# Patient Record
Sex: Female | Born: 1960
Health system: Southern US, Community
[De-identification: ages and names within clinical notes are randomized; demographics above are authoritative.]

## PROBLEM LIST (undated history)

## (undated) DIAGNOSIS — R0602 Shortness of breath: Secondary | ICD-10-CM

## (undated) DIAGNOSIS — N979 Female infertility, unspecified: Secondary | ICD-10-CM

## (undated) DIAGNOSIS — E039 Hypothyroidism, unspecified: Secondary | ICD-10-CM

## (undated) DIAGNOSIS — J45909 Unspecified asthma, uncomplicated: Secondary | ICD-10-CM

## (undated) DIAGNOSIS — D649 Anemia, unspecified: Secondary | ICD-10-CM

## (undated) DIAGNOSIS — E669 Obesity, unspecified: Secondary | ICD-10-CM

## (undated) DIAGNOSIS — K219 Gastro-esophageal reflux disease without esophagitis: Secondary | ICD-10-CM

## (undated) DIAGNOSIS — F32A Depression, unspecified: Secondary | ICD-10-CM

## (undated) DIAGNOSIS — M1990 Unspecified osteoarthritis, unspecified site: Secondary | ICD-10-CM

## (undated) DIAGNOSIS — E559 Vitamin D deficiency, unspecified: Secondary | ICD-10-CM

## (undated) DIAGNOSIS — T7840XA Allergy, unspecified, initial encounter: Secondary | ICD-10-CM

## (undated) HISTORY — DX: Vitamin D deficiency, unspecified: E55.9

## (undated) HISTORY — DX: Female infertility, unspecified: N97.9

## (undated) HISTORY — DX: Gastro-esophageal reflux disease without esophagitis: K21.9

## (undated) HISTORY — DX: Unspecified osteoarthritis, unspecified site: M19.90

## (undated) HISTORY — DX: Unspecified asthma, uncomplicated: J45.909

## (undated) HISTORY — DX: Allergy, unspecified, initial encounter: T78.40XA

## (undated) HISTORY — DX: Hypothyroidism, unspecified: E03.9

## (undated) HISTORY — DX: Depression, unspecified: F32.A

## (undated) HISTORY — DX: Obesity, unspecified: E66.9

## (undated) HISTORY — DX: Anemia, unspecified: D64.9

## (undated) HISTORY — DX: Shortness of breath: R06.02

---
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
## 1996-01-13 HISTORY — PX: OTHER SURGICAL HISTORY: SHX169

## 1997-05-31 ENCOUNTER — Other Ambulatory Visit: Admission: RE | Admit: 1997-05-31 | Discharge: 1997-05-31 | Payer: Self-pay | Admitting: Obstetrics and Gynecology

## 1997-06-05 ENCOUNTER — Ambulatory Visit (HOSPITAL_COMMUNITY): Admission: RE | Admit: 1997-06-05 | Discharge: 1997-06-05 | Payer: Self-pay | Admitting: Obstetrics and Gynecology

## 1997-06-08 ENCOUNTER — Ambulatory Visit (HOSPITAL_COMMUNITY): Admission: RE | Admit: 1997-06-08 | Discharge: 1997-06-08 | Payer: Self-pay | Admitting: Obstetrics and Gynecology

## 1997-06-12 ENCOUNTER — Ambulatory Visit (HOSPITAL_COMMUNITY): Admission: RE | Admit: 1997-06-12 | Discharge: 1997-06-12 | Payer: Self-pay | Admitting: Obstetrics and Gynecology

## 1997-06-27 ENCOUNTER — Ambulatory Visit (HOSPITAL_COMMUNITY): Admission: RE | Admit: 1997-06-27 | Discharge: 1997-06-27 | Payer: Self-pay | Admitting: Obstetrics and Gynecology

## 1997-06-29 ENCOUNTER — Ambulatory Visit (HOSPITAL_COMMUNITY): Admission: RE | Admit: 1997-06-29 | Discharge: 1997-06-29 | Payer: Self-pay | Admitting: Obstetrics and Gynecology

## 1997-07-20 ENCOUNTER — Other Ambulatory Visit: Admission: RE | Admit: 1997-07-20 | Discharge: 1997-07-20 | Payer: Self-pay | Admitting: Obstetrics and Gynecology

## 1997-12-20 ENCOUNTER — Encounter: Admission: RE | Admit: 1997-12-20 | Discharge: 1998-03-20 | Payer: Self-pay | Admitting: Obstetrics & Gynecology

## 1998-02-11 ENCOUNTER — Inpatient Hospital Stay (HOSPITAL_COMMUNITY): Admission: AD | Admit: 1998-02-11 | Discharge: 1998-02-11 | Payer: Self-pay | Admitting: Obstetrics & Gynecology

## 1998-02-13 ENCOUNTER — Inpatient Hospital Stay (HOSPITAL_COMMUNITY): Admission: AD | Admit: 1998-02-13 | Discharge: 1998-02-13 | Payer: Self-pay | Admitting: Obstetrics and Gynecology

## 1998-02-15 ENCOUNTER — Inpatient Hospital Stay (HOSPITAL_COMMUNITY): Admission: AD | Admit: 1998-02-15 | Discharge: 1998-02-19 | Payer: Self-pay | Admitting: Obstetrics and Gynecology

## 1998-02-18 ENCOUNTER — Encounter (HOSPITAL_COMMUNITY): Admission: RE | Admit: 1998-02-18 | Discharge: 1998-05-19 | Payer: Self-pay | Admitting: Obstetrics and Gynecology

## 1998-04-15 ENCOUNTER — Encounter: Admission: RE | Admit: 1998-04-15 | Discharge: 1998-07-14 | Payer: Self-pay | Admitting: Obstetrics and Gynecology

## 1999-07-24 ENCOUNTER — Other Ambulatory Visit: Admission: RE | Admit: 1999-07-24 | Discharge: 1999-07-24 | Payer: Self-pay | Admitting: *Deleted

## 2001-11-21 ENCOUNTER — Other Ambulatory Visit: Admission: RE | Admit: 2001-11-21 | Discharge: 2001-11-21 | Payer: Self-pay | Admitting: Obstetrics and Gynecology

## 2003-10-30 ENCOUNTER — Other Ambulatory Visit: Admission: RE | Admit: 2003-10-30 | Discharge: 2003-10-30 | Payer: Self-pay | Admitting: Obstetrics and Gynecology

## 2004-04-14 ENCOUNTER — Other Ambulatory Visit: Admission: RE | Admit: 2004-04-14 | Discharge: 2004-04-14 | Payer: Self-pay | Admitting: Obstetrics and Gynecology

## 2010-02-13 DIAGNOSIS — E039 Hypothyroidism, unspecified: Secondary | ICD-10-CM | POA: Insufficient documentation

## 2010-02-13 DIAGNOSIS — J309 Allergic rhinitis, unspecified: Secondary | ICD-10-CM | POA: Insufficient documentation

## 2010-10-31 ENCOUNTER — Emergency Department (HOSPITAL_COMMUNITY)
Admission: EM | Admit: 2010-10-31 | Discharge: 2010-11-01 | Disposition: A | Discharge status: Home / Self Care | Payer: 59 | Attending: Emergency Medicine | Admitting: Emergency Medicine

## 2010-10-31 ENCOUNTER — Inpatient Hospital Stay (INDEPENDENT_AMBULATORY_CARE_PROVIDER_SITE_OTHER)
Admission: RE | Admit: 2010-10-31 | Discharge: 2010-10-31 | Disposition: A | Discharge status: Home / Self Care | Payer: 59 | Source: Ambulatory Visit | Attending: Emergency Medicine | Admitting: Emergency Medicine

## 2010-10-31 ENCOUNTER — Emergency Department (HOSPITAL_COMMUNITY): Payer: 59

## 2010-10-31 DIAGNOSIS — R5381 Other malaise: Secondary | ICD-10-CM | POA: Insufficient documentation

## 2010-10-31 DIAGNOSIS — R0602 Shortness of breath: Secondary | ICD-10-CM | POA: Insufficient documentation

## 2010-10-31 DIAGNOSIS — E079 Disorder of thyroid, unspecified: Secondary | ICD-10-CM | POA: Insufficient documentation

## 2010-10-31 DIAGNOSIS — R079 Chest pain, unspecified: Secondary | ICD-10-CM

## 2010-10-31 LAB — DIFFERENTIAL
Basophils Absolute: 0.1 10*3/uL (ref 0.0–0.1)
Lymphocytes Relative: 33 % (ref 12–46)
Lymphs Abs: 4.6 10*3/uL — ABNORMAL HIGH (ref 0.7–4.0)
Monocytes Absolute: 1 10*3/uL (ref 0.1–1.0)
Neutro Abs: 7.7 10*3/uL (ref 1.7–7.7)

## 2010-10-31 LAB — CBC
HCT: 37.9 % (ref 36.0–46.0)
Hemoglobin: 12.3 g/dL (ref 12.0–15.0)
MCHC: 32.5 g/dL (ref 30.0–36.0)
MCV: 79.3 fL (ref 78.0–100.0)
WBC: 13.7 10*3/uL — ABNORMAL HIGH (ref 4.0–10.5)

## 2010-10-31 LAB — BASIC METABOLIC PANEL
BUN: 17 mg/dL (ref 6–23)
Chloride: 105 mEq/L (ref 96–112)
Glucose, Bld: 100 mg/dL — ABNORMAL HIGH (ref 70–99)
Potassium: 3.5 mEq/L (ref 3.5–5.1)

## 2010-10-31 LAB — POCT I-STAT TROPONIN I: Troponin i, poc: 0 ng/mL (ref 0.00–0.08)

## 2011-10-16 ENCOUNTER — Other Ambulatory Visit: Payer: Self-pay | Admitting: Family Medicine

## 2011-10-16 DIAGNOSIS — K219 Gastro-esophageal reflux disease without esophagitis: Secondary | ICD-10-CM | POA: Insufficient documentation

## 2011-10-16 DIAGNOSIS — Z1231 Encounter for screening mammogram for malignant neoplasm of breast: Secondary | ICD-10-CM

## 2011-11-04 ENCOUNTER — Ambulatory Visit: Payer: 59

## 2012-04-22 DIAGNOSIS — J45909 Unspecified asthma, uncomplicated: Secondary | ICD-10-CM | POA: Insufficient documentation

## 2018-01-21 ENCOUNTER — Other Ambulatory Visit: Payer: Self-pay | Admitting: Family Medicine

## 2018-01-21 DIAGNOSIS — Z1231 Encounter for screening mammogram for malignant neoplasm of breast: Secondary | ICD-10-CM

## 2019-02-21 ENCOUNTER — Other Ambulatory Visit: Payer: Self-pay | Admitting: Family Medicine

## 2019-02-21 DIAGNOSIS — Z1231 Encounter for screening mammogram for malignant neoplasm of breast: Secondary | ICD-10-CM

## 2019-03-30 ENCOUNTER — Ambulatory Visit: Payer: Self-pay | Attending: Internal Medicine

## 2019-03-30 DIAGNOSIS — Z23 Encounter for immunization: Secondary | ICD-10-CM

## 2019-03-30 NOTE — Progress Notes (Signed)
   Covid-19 Vaccination Clinic  Name:  HAYDAN WEDIG    MRN: [id number] DOB: Jun 01, 1960  03/30/2019  Ms. Marsico was observed post Covid-19 immunization for 15 minutes without incident. She was provided with Vaccine Information Sheet and instruction to access the V-Safe system.   Ms. Elliston was instructed to call 911 with any severe reactions post vaccine: Marland Kitchen Difficulty breathing  . Swelling of face and throat  . A fast heartbeat  . A bad rash all over body  . Dizziness and weakness   Immunizations Administered    Name Date Dose VIS Date Route   Pfizer COVID-19 Vaccine 03/30/2019  1:42 PM 0.3 mL 12/23/2018 Intramuscular   Manufacturer: [employer], [employer]   Lot: [id number]   NDC: [address]-5

## 2019-04-07 ENCOUNTER — Ambulatory Visit
Admission: RE | Admit: 2019-04-07 | Discharge: 2019-04-07 | Disposition: A | Discharge status: Home / Self Care | Payer: BC Managed Care – PPO | Source: Ambulatory Visit | Attending: Family Medicine | Admitting: Family Medicine

## 2019-04-07 ENCOUNTER — Other Ambulatory Visit: Payer: Self-pay

## 2019-04-07 DIAGNOSIS — Z1231 Encounter for screening mammogram for malignant neoplasm of breast: Secondary | ICD-10-CM

## 2019-04-24 ENCOUNTER — Ambulatory Visit: Payer: BC Managed Care – PPO | Attending: Internal Medicine

## 2019-04-24 DIAGNOSIS — Z23 Encounter for immunization: Secondary | ICD-10-CM

## 2019-04-24 NOTE — Progress Notes (Signed)
   Covid-19 Vaccination Clinic  Name:  Janice Brown    MRN: [id number] DOB: 1960-07-07  04/24/2019  [name] was observed post Covid-19 immunization for 15 minutes without incident. She was provided with Vaccine Information Sheet and instruction to access the V-Safe system.   [name] was instructed to call 911 with any severe reactions post vaccine: Marland Kitchen Difficulty breathing  . Swelling of face and throat  . A fast heartbeat  . A bad rash all over body  . Dizziness and weakness   Immunizations Administered    Name Date Dose VIS Date Route   Pfizer COVID-19 Vaccine 04/24/2019  2:48 PM 0.3 mL 12/23/2018 Intramuscular   Manufacturer: [employer], [employer]   Lot: [id number]   NDC: [address]-3

## 2019-05-25 ENCOUNTER — Encounter (INDEPENDENT_AMBULATORY_CARE_PROVIDER_SITE_OTHER): Payer: Self-pay | Admitting: Family Medicine

## 2019-05-25 ENCOUNTER — Other Ambulatory Visit: Payer: Self-pay

## 2019-05-25 ENCOUNTER — Ambulatory Visit (INDEPENDENT_AMBULATORY_CARE_PROVIDER_SITE_OTHER): Payer: BC Managed Care – PPO | Admitting: Family Medicine

## 2019-05-25 VITALS — BP 108/70 | HR 63 | Temp 97.9°F | Ht 64.0 in | Wt 222.0 lb

## 2019-05-25 DIAGNOSIS — R632 Polyphagia: Secondary | ICD-10-CM

## 2019-05-25 DIAGNOSIS — Z6838 Body mass index (BMI) 38.0-38.9, adult: Secondary | ICD-10-CM

## 2019-05-25 DIAGNOSIS — R0602 Shortness of breath: Secondary | ICD-10-CM | POA: Diagnosis not present

## 2019-05-25 DIAGNOSIS — E66812 Obesity, class 2: Secondary | ICD-10-CM

## 2019-05-25 DIAGNOSIS — R5383 Other fatigue: Secondary | ICD-10-CM | POA: Diagnosis not present

## 2019-05-25 DIAGNOSIS — F5089 Other specified eating disorder: Secondary | ICD-10-CM

## 2019-05-25 DIAGNOSIS — E039 Hypothyroidism, unspecified: Secondary | ICD-10-CM | POA: Diagnosis not present

## 2019-05-25 DIAGNOSIS — Z1331 Encounter for screening for depression: Secondary | ICD-10-CM | POA: Diagnosis not present

## 2019-05-25 DIAGNOSIS — Z9189 Other specified personal risk factors, not elsewhere classified: Secondary | ICD-10-CM | POA: Diagnosis not present

## 2019-05-25 DIAGNOSIS — Z0289 Encounter for other administrative examinations: Secondary | ICD-10-CM

## 2019-05-25 NOTE — Progress Notes (Signed)
Chief Complaint:   OBESITY Janice Brown (MR# [id number]) is a 59 y.o. female who presents for evaluation and treatment of obesity and related comorbidities. Current BMI is Body mass index is 38.11 kg/m. Janice Brown has been struggling with her weight for many years and has been unsuccessful in either losing weight, maintaining weight loss, or reaching her healthy weight goal.  Janice Brown is currently in the action stage of change and ready to dedicate time achieving and maintaining a healthier weight. Janice Brown is interested in becoming our patient and working on intensive lifestyle modifications including (but not limited to) diet and exercise for weight loss.  Janice Brown is on a whole food, plant-based diet.  She has a Optometrist and swims daily.    Janice Brown provided the following food recall today:  Breakfast:  Muesli, oats, pumpkin seed, almonds, dates, plant milk, frozen wild blueberries. Snack:  2 cups of coffee. Lunch:  Salad, chick pea curry, lentil soup. Snack:  Hummus. Dinner:  Janice Brown tends to be a night eater, but this is improving.   Janice Brown's habits were reviewed today and are as follows: Her family eats meals together, she thinks her family will eat healthier with her, her desired weight loss is 87 pounds, she has been heavy most of her life, she started gaining weight in her 12s, her heaviest weight ever was 240 pounds, she is trying to follow a vegan diet, she is frequently drinking liquids with calories, she frequently eats larger portions than normal and she struggles with emotional eating.  Depression Screen Janice Brown's Food and Mood (modified PHQ-9) score was 8.  Depression screen PHQ 2/9 05/25/2019  Decreased Interest 1  Down, Depressed, Hopeless 1  PHQ - 2 Score 2  Altered sleeping 0  Tired, decreased energy 2  Change in appetite 1  Feeling bad or failure about yourself  2  Trouble concentrating 0  Moving slowly or fidgety/restless 1  Suicidal  thoughts 0  PHQ-9 Score 8  Difficult doing work/chores Somewhat difficult   Subjective:   1. Other fatigue Janice Brown denies daytime somnolence and denies waking up still tired. Janice Brown generally gets 7 or 8 hours of sleep per night, and states that she has generally restful sleep. Snoring is not present. Apneic episodes are not present. Epworth Sleepiness Score is 3.  2. SOB (shortness of breath) on exertion Janice Brown notes increasing shortness of breath with exercising and seems to be worsening over time with weight gain. She notes getting out of breath sooner with activity than she used to. This has gotten worse recently. Janice Brown denies shortness of breath at rest or orthopnea.  3. Hypothyroidism, unspecified type Janice Brown is taking Armour Thyroid 120 mg + 30 mg daily.  4. Polyphagia Janice Brown endorses excessive hunger.   5. 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  6. Depression screening Janice Brown was screened for depression as part of her new patient workup.  PHQ-9 is 8.  7. At risk for deficient intake of food The patient is at a higher than average risk of deficient intake of food.  Assessment/Plan:   1. Other fatigue [name] does feel that her weight is causing her energy to be lower than it should be. Fatigue may be related to obesity, depression or many other causes. Labs will be ordered, and in the meanwhile, Janice Brown will focus on self care including making healthy food choices, increasing physical activity and focusing on stress reduction. - EKG 12-Lead - Comprehensive metabolic panel - CBC with Differential/Platelet -  Hemoglobin A1c - Insulin, random - Lipid panel - VITAMIN D 25 Hydroxy (Vit-D Deficiency, Fractures) - Anemia panel  2. SOB (shortness of breath) on exertion Janice Brown does feel that she gets out of breath more easily that she used to when she exercises. Janice Brown's shortness of breath appears to be obesity related and  exercise induced. She has agreed to work on weight loss and gradually increase exercise to treat her exercise induced shortness of breath. Will continue to monitor closely.  3. Hypothyroidism, unspecified type Patient with long-standing hypothyroidism, on levothyroxine therapy. She appears euthyroid. Orders and follow up as documented in patient record.  Counseling . Good thyroid control is important for overall health. Supratherapeutic thyroid levels are dangerous and will not improve weight loss results. . The correct way to take levothyroxine is fasting, with water, separated by at least 30 minutes from breakfast, and separated by more than 4 hours from calcium, iron, multivitamins, acid reflux medications (PPIs).  - TSH - T4, free - T3  4. Polyphagia Intensive lifestyle modifications are the first line treatment for this issue. We discussed several lifestyle modifications today and she will continue to work on diet, exercise and weight loss efforts. Orders and follow up as documented in patient record.  Counseling . Polyphagia is excessive hunger. . Causes can include: low blood sugars, hypERthyroidism, PMS, lack of sleep, stress, insulin resistance, diabetes, certain medications, and diets that are deficient in protein and fiber.   5. Other disorder of eating Suggest Janice Brown begin going to bed earlier and get some exercise to help relieve stress.  6. Depression screening Janice Brown had a positive depression screening. Depression is commonly associated with obesity and often results in emotional eating behaviors. We will monitor this closely and work on CBT to help improve the non-hunger eating patterns. Referral to Psychology may be required if no improvement is seen as she continues in our clinic.  7. At risk for deficient intake of food Janice Brown was given approximately 15 minutes of deficit intake of food prevention counseling today. Janice Brown is at risk for eating too few calories  based on current food recall. She was encouraged to focus on meeting caloric and protein goals according to her recommended meal plan.   8. Class 2 severe obesity with serious comorbidity and body mass index (BMI) of 38.0 to 38.9 in adult, unspecified obesity type [facility]) Janice Brown is currently in the action stage of change and her goal is to continue with weight loss efforts. I recommend Janice Brown begin the structured treatment plan as follows:  She has agreed to keeping a food journal and adhering to recommended goals of 1500 calories and 95+ grams of protein.  Exercise goals: As is.   Behavioral modification strategies: increasing lean protein intake and ways to avoid night time snacking.  She was informed of the importance of frequent follow-up visits to maximize her success with intensive lifestyle modifications for her multiple health conditions. She was informed we would discuss her lab results at her next visit unless there is a critical issue that needs to be addressed sooner. Janice Brown agreed to keep her next visit at the agreed upon time to discuss these results.  Objective:   Blood pressure 108/70, pulse 63, temperature 97.9 F (36.6 C), temperature source Oral, height 5\' 4"  (1.626 m), weight 222 lb (100.7 kg), SpO2 96 %. Body mass index is 38.11 kg/m.  EKG: Normal sinus rhythm, rate 58 bpm.  Indirect Calorimeter completed today shows a VO2 of 291 and a REE  of 2026.  Her calculated basal metabolic rate is 1829 thus her basal metabolic rate is better than expected.  General: Cooperative, alert, well developed, in no acute distress. HEENT: Conjunctivae and lids unremarkable. Cardiovascular: Regular rhythm.  Lungs: Normal work of breathing. Neurologic: No focal deficits.   Lab Results  Component Value Date   CREATININE 0.65 10/31/2010   BUN 17 10/31/2010   NA 138 10/31/2010   K 3.5 10/31/2010   CL 105 10/31/2010   CO2 24 10/31/2010   Lab Results  Component Value Date    WBC 13.7 (H) 10/31/2010   HGB 12.3 10/31/2010   HCT 37.9 10/31/2010   MCV 79.3 10/31/2010   PLT 353 10/31/2010   Attestation Statements:   This is the patient's first visit at [facility]. The patient's NEW PATIENT PACKET was reviewed at length. Included in the packet: current and past health history, medications, allergies, ROS, gynecologic history (women only), surgical history, family history, social history, weight history, weight loss surgery history (for those that have had weight loss surgery), nutritional evaluation, mood and food questionnaire, PHQ9, Epworth questionnaire, sleep habits questionnaire, patient life and health improvement goals questionnaire. These will all be scanned into the patient's chart under media.   During the visit, I independently reviewed the patient's EKG, bioimpedance scale results, and indirect calorimeter results. I used this information to tailor a meal plan for the patient that will help her to lose weight and will improve her obesity-related conditions going forward. I performed a medically necessary appropriate examination and/or evaluation. I discussed the assessment and treatment plan with the patient. The patient was provided an opportunity to ask questions and all were answered. The patient agreed with the plan and demonstrated an understanding of the instructions. Labs were ordered at this visit and will be reviewed at the next visit unless more critical results need to be addressed immediately. Clinical information was updated and documented in the EMR.   I, Insurance claims handler, CMA, am acting as Energy manager for [employer], DO.  I have reviewed the above documentation for accuracy and completeness, and I agree with the above. Helane Rima, DO

## 2019-05-26 LAB — ANEMIA PANEL
Ferritin: 62 ng/mL (ref 15–150)
Folate, Hemolysate: 285 ng/mL
Folate, RBC: 638 ng/mL (ref >498)
Hematocrit: 44.7 % (ref 34.0–46.6)
Iron Saturation: 28 % (ref 15–55)
Iron: 109 ug/dL (ref 27–159)
Retic Ct Pct: 1.3 % (ref 0.6–2.6)
Total Iron Binding Capacity: 385 ug/dL (ref 250–450)
UIBC: 276 ug/dL (ref 131–425)
Vitamin B-12: 1116 pg/mL (ref 232–1245)

## 2019-05-26 LAB — T4, FREE: Free T4: 0.92 ng/dL (ref 0.82–1.77)

## 2019-05-26 LAB — CBC WITH DIFFERENTIAL/PLATELET
Basophils Absolute: 0.1 10*3/uL (ref 0.0–0.2)
Basos: 2 %
EOS (ABSOLUTE): 0.3 10*3/uL (ref 0.0–0.4)
Eos: 3 %
Hemoglobin: 14.4 g/dL (ref 11.1–15.9)
Immature Grans (Abs): 0 10*3/uL (ref 0.0–0.1)
Immature Granulocytes: 1 %
Lymphocytes Absolute: 2.9 10*3/uL (ref 0.7–3.1)
Lymphs: 34 %
MCH: 27.2 pg (ref 26.6–33.0)
MCHC: 32.2 g/dL (ref 31.5–35.7)
MCV: 84 fL (ref 79–97)
Monocytes Absolute: 0.5 10*3/uL (ref 0.1–0.9)
Monocytes: 6 %
Neutrophils Absolute: 4.8 10*3/uL (ref 1.4–7.0)
Neutrophils: 54 %
Platelets: 414 10*3/uL (ref 150–450)
RBC: 5.3 x10E6/uL — ABNORMAL HIGH (ref 3.77–5.28)
RDW: 13 % (ref 11.7–15.4)
WBC: 8.6 10*3/uL (ref 3.4–10.8)

## 2019-05-26 LAB — COMPREHENSIVE METABOLIC PANEL
ALT: 23 IU/L (ref 0–32)
AST: 20 IU/L (ref 0–40)
Albumin/Globulin Ratio: 1.5 (ref 1.2–2.2)
Albumin: 4.3 g/dL (ref 3.8–4.9)
Alkaline Phosphatase: 111 IU/L (ref 39–117)
BUN/Creatinine Ratio: 13 (ref 9–23)
BUN: 10 mg/dL (ref 6–24)
Bilirubin Total: 0.5 mg/dL (ref 0.0–1.2)
CO2: 23 mmol/L (ref 20–29)
Calcium: 9.8 mg/dL (ref 8.7–10.2)
Chloride: 105 mmol/L (ref 96–106)
Creatinine, Ser: 0.79 mg/dL (ref 0.57–1.00)
GFR calc Af Amer: 95 mL/min/{1.73_m2} (ref >59)
GFR calc non Af Amer: 82 mL/min/{1.73_m2} (ref >59)
Globulin, Total: 2.8 g/dL (ref 1.5–4.5)
Glucose: 86 mg/dL (ref 65–99)
Potassium: 4.8 mmol/L (ref 3.5–5.2)
Sodium: 144 mmol/L (ref 134–144)
Total Protein: 7.1 g/dL (ref 6.0–8.5)

## 2019-05-26 LAB — INSULIN, RANDOM: INSULIN: 17.6 u[IU]/mL (ref 2.6–24.9)

## 2019-05-26 LAB — LIPID PANEL
Chol/HDL Ratio: 3.6 ratio (ref 0.0–4.4)
Cholesterol, Total: 193 mg/dL (ref 100–199)
HDL: 53 mg/dL (ref >39)
LDL Chol Calc (NIH): 112 mg/dL — ABNORMAL HIGH (ref 0–99)
Triglycerides: 163 mg/dL — ABNORMAL HIGH (ref 0–149)
VLDL Cholesterol Cal: 28 mg/dL (ref 5–40)

## 2019-05-26 LAB — HEMOGLOBIN A1C
Est. average glucose Bld gHb Est-mCnc: 120 mg/dL
Hgb A1c MFr Bld: 5.8 % — ABNORMAL HIGH (ref 4.8–5.6)

## 2019-05-26 LAB — VITAMIN D 25 HYDROXY (VIT D DEFICIENCY, FRACTURES): Vit D, 25-Hydroxy: 36.7 ng/mL (ref 30.0–100.0)

## 2019-05-26 LAB — TSH: TSH: 0.235 u[IU]/mL — ABNORMAL LOW (ref 0.450–4.500)

## 2019-05-26 LAB — T3: T3, Total: 124 ng/dL (ref 71–180)

## 2019-06-08 ENCOUNTER — Encounter (INDEPENDENT_AMBULATORY_CARE_PROVIDER_SITE_OTHER): Payer: Self-pay | Admitting: Family Medicine

## 2019-06-08 ENCOUNTER — Ambulatory Visit (INDEPENDENT_AMBULATORY_CARE_PROVIDER_SITE_OTHER): Payer: BC Managed Care – PPO | Admitting: Family Medicine

## 2019-06-08 ENCOUNTER — Other Ambulatory Visit: Payer: Self-pay

## 2019-06-08 VITALS — BP 116/77 | HR 47 | Temp 97.5°F | Ht 64.0 in | Wt 220.0 lb

## 2019-06-08 DIAGNOSIS — E559 Vitamin D deficiency, unspecified: Secondary | ICD-10-CM

## 2019-06-08 DIAGNOSIS — E785 Hyperlipidemia, unspecified: Secondary | ICD-10-CM

## 2019-06-08 DIAGNOSIS — R7303 Prediabetes: Secondary | ICD-10-CM

## 2019-06-08 DIAGNOSIS — Z6837 Body mass index (BMI) 37.0-37.9, adult: Secondary | ICD-10-CM

## 2019-06-08 DIAGNOSIS — E66812 Obesity, class 2: Secondary | ICD-10-CM

## 2019-06-13 MED ORDER — OZEMPIC (0.25 OR 0.5 MG/DOSE) 2 MG/1.5ML ~~LOC~~ SOPN: PEN_INJECTOR | SUBCUTANEOUS | 0 refills | Status: DC

## 2019-06-13 NOTE — Progress Notes (Signed)
Chief Complaint:   OBESITY Janice Brown is here to discuss her progress with her obesity treatment plan along with follow-up of her obesity related diagnoses. Janice Brown is on keeping a food journal and adhering to recommended goals of 1500 calories and 90 grams of protein and states she is following her eating plan approximately 100% of the time. Janice Brown states she is swimming and using the stationary bike for 45-50 minutes 7 times per week.  Today's visit was #: 2 Starting weight: 222 lbs Starting date: 05/25/2019 Today's weight: 220 lbs Today's date: 06/08/2019 Total lbs lost to date: 2 lbs Total lbs lost since last in-office visit: 2 lbs  Interim History: Janice Brown is doing well.  She is getting around 1500 calories and 86-103 grams of protein per day.  She is up 3 pounds of water weight.  Subjective:   1. Prediabetes Janice Brown has a diagnosis of prediabetes based on her elevated HgA1c and was informed this puts her at greater risk of developing diabetes. She continues to work on diet and exercise to decrease her risk of diabetes. She denies nausea or hypoglycemia.  Lab Results  Component Value Date   HGBA1C 5.8 (H) 05/25/2019   Lab Results  Component Value Date   INSULIN 17.6 05/25/2019   2. Hyperlipidemia, unspecified hyperlipidemia type Janice Brown has hyperlipidemia and has been trying to improve her cholesterol levels with intensive lifestyle modification including a low saturated fat diet, exercise and weight loss. She denies any chest pain, claudication or myalgias.  Lab Results  Component Value Date   ALT 23 05/25/2019   AST 20 05/25/2019   ALKPHOS 111 05/25/2019   BILITOT 0.5 05/25/2019   Lab Results  Component Value Date   CHOL 193 05/25/2019   HDL 53 05/25/2019   LDLCALC 112 (H) 05/25/2019   TRIG 163 (H) 05/25/2019   CHOLHDL 3.6 05/25/2019   3. Vitamin D deficiency Janice Brown's Vitamin D level was 05/25/2019 on 36.7. She is currently taking OTC vitamin D  5000 IU each day. She denies nausea, vomiting or muscle weakness.  Assessment/Plan:   1. Prediabetes Janice Brown will continue to work on weight loss, exercise, and decreasing simple carbohydrates to help decrease the risk of diabetes.   Orders - Semaglutide,0.25 or 0.5MG /DOS, (OZEMPIC, 0.25 OR 0.5 MG/DOSE,) 2 MG/1.5ML SOPN; Start with 0.25 weekly for 2 weeks then increase to 0.5 mg weekly  Dispense: 5 pen; Refill: 0  2. Hyperlipidemia, unspecified hyperlipidemia type Cardiovascular risk and specific lipid/LDL goals reviewed.  We discussed several lifestyle modifications today and Janice Brown will continue to work on diet, exercise and weight loss efforts. Orders and follow up as documented in patient record.   Counseling Intensive lifestyle modifications are the first line treatment for this issue. . Dietary changes: Increase soluble fiber. Decrease simple carbohydrates. . Exercise changes: Moderate to vigorous-intensity aerobic activity 150 minutes per week if tolerated. . Lipid-lowering medications: see documented in medical record.  3. Vitamin D deficiency Low Vitamin D level contributes to fatigue and are associated with obesity, breast, and colon cancer. She agrees to continue to take OTC Vitamin D @5 ,000 IU daily and will follow-up for routine testing of Vitamin D, at least 2-3 times per year to avoid over-replacement.  4. Class 2 severe obesity with serious comorbidity and body mass index (BMI) of 37.0 to 37.9 in adult, unspecified obesity type [facility]) Janice Brown is currently in the action stage of change. As such, her goal is to continue with weight loss efforts. She has agreed to  keeping a food journal and adhering to recommended goals of 1500 calories and 90 grams of protein and vegan diet.   Exercise goals: For substantial health benefits, adults should do at least 150 minutes (2 hours and 30 minutes) a week of moderate-intensity, or 75 minutes (1 hour and 15 minutes) a week of  vigorous-intensity aerobic physical activity, or an equivalent combination of moderate- and vigorous-intensity aerobic activity. Aerobic activity should be performed in episodes of at least 10 minutes, and preferably, it should be spread throughout the week.  Behavioral modification strategies: increasing lean protein intake and increasing water intake.  Janice Brown has agreed to follow-up with our clinic in 2 weeks. She was informed of the importance of frequent follow-up visits to maximize her success with intensive lifestyle modifications for her multiple health conditions.   Objective:   Blood pressure 116/77, pulse (!) 47, temperature (!) 97.5 F (36.4 C), temperature source Oral, height 5\' 4"  (1.626 m), weight 220 lb (99.8 kg), SpO2 98 %. Body mass index is 37.76 kg/m.  General: Cooperative, alert, well developed, in no acute distress. HEENT: Conjunctivae and lids unremarkable. Cardiovascular: Regular rhythm.  Lungs: Normal work of breathing. Neurologic: No focal deficits.   Lab Results  Component Value Date   CREATININE 0.79 05/25/2019   BUN 10 05/25/2019   NA 144 05/25/2019   K 4.8 05/25/2019   CL 105 05/25/2019   CO2 23 05/25/2019   Lab Results  Component Value Date   ALT 23 05/25/2019   AST 20 05/25/2019   ALKPHOS 111 05/25/2019   BILITOT 0.5 05/25/2019   Lab Results  Component Value Date   HGBA1C 5.8 (H) 05/25/2019   Lab Results  Component Value Date   INSULIN 17.6 05/25/2019   Lab Results  Component Value Date   TSH 0.235 (L) 05/25/2019   Lab Results  Component Value Date   CHOL 193 05/25/2019   HDL 53 05/25/2019   LDLCALC 112 (H) 05/25/2019   TRIG 163 (H) 05/25/2019   CHOLHDL 3.6 05/25/2019   Lab Results  Component Value Date   WBC 8.6 05/25/2019   HGB 14.4 05/25/2019   HCT 44.7 05/25/2019   MCV 84 05/25/2019   PLT 414 05/25/2019   Lab Results  Component Value Date   IRON 109 05/25/2019   TIBC 385 05/25/2019   FERRITIN 62 05/25/2019    Attestation Statements:   Reviewed by clinician on day of visit: allergies, medications, problem list, medical history, surgical history, family history, social history, and previous encounter notes.  I, 05/27/2019, CMA, am acting as Insurance claims handler for Energy manager, DO.  I have reviewed the above documentation for accuracy and completeness, and I agree with the above. [employer], DO

## 2019-06-19 ENCOUNTER — Encounter (INDEPENDENT_AMBULATORY_CARE_PROVIDER_SITE_OTHER): Payer: Self-pay | Admitting: Family Medicine

## 2019-07-04 ENCOUNTER — Ambulatory Visit (INDEPENDENT_AMBULATORY_CARE_PROVIDER_SITE_OTHER): Payer: BC Managed Care – PPO | Admitting: Family Medicine

## 2019-07-04 ENCOUNTER — Encounter (INDEPENDENT_AMBULATORY_CARE_PROVIDER_SITE_OTHER): Payer: Self-pay | Admitting: Family Medicine

## 2019-07-04 ENCOUNTER — Other Ambulatory Visit: Payer: Self-pay

## 2019-07-04 VITALS — BP 109/70 | HR 46 | Temp 97.7°F | Ht 64.0 in | Wt 217.0 lb

## 2019-07-04 DIAGNOSIS — Z9189 Other specified personal risk factors, not elsewhere classified: Secondary | ICD-10-CM | POA: Diagnosis not present

## 2019-07-04 DIAGNOSIS — R632 Polyphagia: Secondary | ICD-10-CM

## 2019-07-04 DIAGNOSIS — Z6837 Body mass index (BMI) 37.0-37.9, adult: Secondary | ICD-10-CM

## 2019-07-04 MED ORDER — PHENTERMINE HCL 37.5 MG PO TABS: 18.7500 mg | ORAL_TABLET | Freq: Every day | ORAL | 1 refills | Status: DC

## 2019-07-04 NOTE — Progress Notes (Signed)
Chief Complaint:   OBESITY Janice Brown is here to discuss her progress with her obesity treatment plan along with follow-up of her obesity related diagnoses. Janice Brown is on keeping a food journal and adhering to recommended goals of 1500 calories and 95 grams of protein and states she is following her eating plan approximately 90% of the time. Janice Brown states she is swimming, strength training, and doing cardio for 30-60 minutes 3-7 times per week.  Today's visit was #: 3 Starting weight: 222 lbs Starting date: 05/25/2019 Today's weight: 217 lbs Today's date: 07/04/2019 Total lbs lost to date: 5 lbs Total lbs lost since last in-office visit: 3 lbs  Interim History:  Since she has increased her exercise, she has increased hunger.  She feels fatigued.    Subjective:   1. Polyphagia Janice Brown endorses excessive hunger.   2. At risk for constipation Janice Brown is at increased risk for constipation due to inadequate water intake, changes in diet, and/or use of medications such as GLP1 agonists. Janice Brown denies hard, infrequent stools currently.   Assessment/Plan:   1. Polyphagia Intensive lifestyle modifications are the first line treatment for this issue. We discussed several lifestyle modifications today and she will continue to work on diet, exercise and weight loss efforts. Orders and follow up as documented in patient record.  Counseling . Polyphagia is excessive hunger. . Causes can include: low blood sugars, hypERthyroidism, PMS, lack of sleep, stress, insulin resistance, diabetes, certain medications, and diets that are deficient in protein and fiber.   Orders - phentermine (ADIPEX-P) 37.5 MG tablet; Take 0.5-1 tablets (18.75-37.5 mg total) by mouth daily before breakfast.  Dispense: 30 tablet; Refill: 1 - Insulin Pen Needle 32G X 4 MM MISC; Use with Saxenda Daily  Dispense: 100 each; Refill: 0  2. At risk for constipation Janice Brown was given approximately 15 minutes of  counseling today regarding prevention of constipation. She was encouraged to increase water and fiber intake.   3. Class 2 severe obesity with serious comorbidity and body mass index (BMI) of 37.0 to 37.9 in adult, unspecified obesity type ([place])  Orders - Liraglutide -Weight Management (SAXENDA) 18 MG/3ML SOPN; Inject 0.5 mLs (3 mg total) into the skin daily.  Dispense: 5 pen; Refill: 0 - Insulin Pen Needle 32G X 4 MM MISC; Use with Saxenda Daily  Dispense: 100 each; Refill: 0  Janice Brown is currently in the action stage of change. As such, her goal is to continue with weight loss efforts. She has agreed to keeping a food journal and adhering to recommended goals of 1500-1700 calories and 95+ grams of protein.   Exercise goals: As is.  Behavioral modification strategies: increasing lean protein intake.  Janice Brown has agreed to follow-up with our clinic in 3 weeks. She was informed of the importance of frequent follow-up visits to maximize her success with intensive lifestyle modifications for her multiple health conditions.   Objective:   Blood pressure 109/70, pulse (!) 46, temperature 97.7 F (36.5 C), temperature source Oral, height 5\' 4"  (1.626 m), weight 217 lb (98.4 kg), SpO2 98 %. Body mass index is 37.25 kg/m.  General: Cooperative, alert, well developed, in no acute distress. HEENT: Conjunctivae and lids unremarkable. Cardiovascular: Regular rhythm.  Lungs: Normal work of breathing. Neurologic: No focal deficits.   Lab Results  Component Value Date   CREATININE 0.79 05/25/2019   BUN 10 05/25/2019   NA 144 05/25/2019   K 4.8 05/25/2019   CL 105 05/25/2019   CO2 23 05/25/2019  Lab Results  Component Value Date   ALT 23 05/25/2019   AST 20 05/25/2019   ALKPHOS 111 05/25/2019   BILITOT 0.5 05/25/2019   Lab Results  Component Value Date   HGBA1C 5.8 (H) 05/25/2019   Lab Results  Component Value Date   INSULIN 17.6 05/25/2019   Lab Results  Component Value  Date   TSH 0.235 (L) 05/25/2019   Lab Results  Component Value Date   CHOL 193 05/25/2019   HDL 53 05/25/2019   LDLCALC 112 (H) 05/25/2019   TRIG 163 (H) 05/25/2019   CHOLHDL 3.6 05/25/2019   Lab Results  Component Value Date   WBC 8.6 05/25/2019   HGB 14.4 05/25/2019   HCT 44.7 05/25/2019   MCV 84 05/25/2019   PLT 414 05/25/2019   Lab Results  Component Value Date   IRON 109 05/25/2019   TIBC 385 05/25/2019   FERRITIN 62 05/25/2019   Attestation Statements:   Reviewed by clinician on day of visit: allergies, medications, problem list, medical history, surgical history, family history, social history, and previous encounter notes.  I, Insurance claims handler, CMA, am acting as transcriptionist for Helane Rima, DO  I have reviewed the above documentation for accuracy and completeness, and I agree with the above. Helane Rima, DO

## 2019-07-05 ENCOUNTER — Encounter (INDEPENDENT_AMBULATORY_CARE_PROVIDER_SITE_OTHER): Payer: Self-pay | Admitting: Family Medicine

## 2019-07-13 ENCOUNTER — Encounter (INDEPENDENT_AMBULATORY_CARE_PROVIDER_SITE_OTHER): Payer: Self-pay | Admitting: Family Medicine

## 2019-07-13 NOTE — Telephone Encounter (Signed)
Please review We listed Wellbutrin as a tried medication to ry to get it approved

## 2019-07-26 ENCOUNTER — Other Ambulatory Visit: Payer: Self-pay

## 2019-07-26 ENCOUNTER — Encounter (INDEPENDENT_AMBULATORY_CARE_PROVIDER_SITE_OTHER): Payer: Self-pay | Admitting: Family Medicine

## 2019-07-26 ENCOUNTER — Ambulatory Visit (INDEPENDENT_AMBULATORY_CARE_PROVIDER_SITE_OTHER): Payer: BC Managed Care – PPO | Admitting: Family Medicine

## 2019-07-26 VITALS — BP 106/68 | HR 59 | Temp 97.7°F | Ht 64.0 in | Wt 214.0 lb

## 2019-07-26 DIAGNOSIS — Z6836 Body mass index (BMI) 36.0-36.9, adult: Secondary | ICD-10-CM

## 2019-07-26 DIAGNOSIS — R7303 Prediabetes: Secondary | ICD-10-CM

## 2019-07-26 DIAGNOSIS — Z9189 Other specified personal risk factors, not elsewhere classified: Secondary | ICD-10-CM | POA: Diagnosis not present

## 2019-07-26 DIAGNOSIS — E038 Other specified hypothyroidism: Secondary | ICD-10-CM | POA: Diagnosis not present

## 2019-07-26 MED ORDER — WEGOVY 0.25 MG/0.5ML ~~LOC~~ SOAJ: 0.2500 mg | SUBCUTANEOUS | 0 refills | Status: DC

## 2019-07-27 ENCOUNTER — Encounter (INDEPENDENT_AMBULATORY_CARE_PROVIDER_SITE_OTHER): Payer: Self-pay | Admitting: Family Medicine

## 2019-07-27 NOTE — Progress Notes (Signed)
Chief Complaint:   OBESITY Janice Brown is here to discuss her progress with her obesity treatment plan along with follow-up of her obesity related diagnoses. Janice Brown is on keeping a food journal and adhering to recommended goals of 1500 calories and 95 grams of protein and states she is following her eating plan approximately 80-85% of the time. Janice Brown states she is swimming, strength training, and doing cardio 2-5 times per week.  Today's visit was #: 4 Starting weight: 222 lbs Starting date: 05/25/2019 Today's weight: 214 lbs Today's date: 07/26/2019 Total lbs lost to date: 18 lbs Total lbs lost since last in-office visit: 3 lbs  Interim History: Janice Brown says that Bernie Covey is helping increase her heart rate and energy.  She is experiencing some constipation.  Today's bioimpedance results indicate that Janice Brown has gained 4 pounds of water weight since her last visit.  Subjective:   1. Other specified hypothyroidism Janice Brown takes Armour Thyrid 150 mg daily.   Lab Results  Component Value Date   TSH 0.235 (L) 05/25/2019   2. Prediabetes Janice Brown has a diagnosis of prediabetes based on her elevated HgA1c and was informed this puts her at greater risk of developing diabetes. She continues to work on diet and exercise to decrease her risk of diabetes. She denies nausea or hypoglycemia.  Lab Results  Component Value Date   HGBA1C 5.8 (H) 05/25/2019   Lab Results  Component Value Date   INSULIN 17.6 05/25/2019   3. At risk for heart disease Janice Brown is at a higher than average risk for cardiovascular disease due to obesity.   Assessment/Plan:   1. Other specified hypothyroidism Patient with long-standing hypothyroidism, on levothyroxine therapy. She appears euthyroid. Orders and follow up as documented in patient record.  Counseling . Good thyroid control is important for overall health. Supratherapeutic thyroid levels are dangerous and will not improve weight  loss results. . The correct way to take levothyroxine is fasting, with water, separated by at least 30 minutes from breakfast, and separated by more than 4 hours from calcium, iron, multivitamins, acid reflux medications (PPIs).   2. Prediabetes Janice Brown will continue to work on weight loss, exercise, and decreasing simple carbohydrates to help decrease the risk of diabetes.   3. At risk for heart disease Janice Brown was given approximately 15 minutes of coronary artery disease prevention counseling today. She is 59 y.o. female and has risk factors for heart disease including obesity. We discussed intensive lifestyle modifications today with an emphasis on specific weight loss instructions and strategies.   Repetitive spaced learning was employed today to elicit superior memory formation and behavioral change.  4. Class 2 severe obesity with serious comorbidity and body mass index (BMI) of 36.0 to 36.9 in adult, unspecified obesity type (HCC)  Orders - Semaglutide-Weight Management (WEGOVY) 0.25 MG/0.5ML SOAJ; Inject 0.25 mg into the skin once a week.  Dispense: 2 mL; Refill: 0  Janice Brown is currently in the action stage of change. As such, her goal is to continue with weight loss efforts. She has agreed to keeping a food journal and adhering to recommended goals of 1500 calories and 95 grams of protein.   Exercise goals: For substantial health benefits, adults should do at least 150 minutes (2 hours and 30 minutes) a week of moderate-intensity, or 75 minutes (1 hour and 15 minutes) a week of vigorous-intensity aerobic physical activity, or an equivalent combination of moderate- and vigorous-intensity aerobic activity. Aerobic activity should be performed in episodes of at least 10  minutes, and preferably, it should be spread throughout the week.  Behavioral modification strategies: increasing lean protein intake and increasing high fiber foods.  Janice Brown has agreed to follow-up with our clinic  in 2-3 weeks. She was informed of the importance of frequent follow-up visits to maximize her success with intensive lifestyle modifications for her multiple health conditions.   Objective:   Blood pressure 106/68, pulse (!) 59, temperature 97.7 F (36.5 C), temperature source Oral, height 5\' 4"  (1.626 m), weight 214 lb (97.1 kg), SpO2 98 %. Body mass index is 36.73 kg/m.  General: Cooperative, alert, well developed, in no acute distress. HEENT: Conjunctivae and lids unremarkable. Cardiovascular: Regular rhythm.  Lungs: Normal work of breathing. Neurologic: No focal deficits.   Lab Results  Component Value Date   CREATININE 0.79 05/25/2019   BUN 10 05/25/2019   NA 144 05/25/2019   K 4.8 05/25/2019   CL 105 05/25/2019   CO2 23 05/25/2019   Lab Results  Component Value Date   ALT 23 05/25/2019   AST 20 05/25/2019   ALKPHOS 111 05/25/2019   BILITOT 0.5 05/25/2019   Lab Results  Component Value Date   HGBA1C 5.8 (H) 05/25/2019   Lab Results  Component Value Date   INSULIN 17.6 05/25/2019   Lab Results  Component Value Date   TSH 0.235 (L) 05/25/2019   Lab Results  Component Value Date   CHOL 193 05/25/2019   HDL 53 05/25/2019   LDLCALC 112 (H) 05/25/2019   TRIG 163 (H) 05/25/2019   CHOLHDL 3.6 05/25/2019   Lab Results  Component Value Date   WBC 8.6 05/25/2019   HGB 14.4 05/25/2019   HCT 44.7 05/25/2019   MCV 84 05/25/2019   PLT 414 05/25/2019   Lab Results  Component Value Date   IRON 109 05/25/2019   TIBC 385 05/25/2019   FERRITIN 62 05/25/2019   Attestation Statements:   Reviewed by clinician on day of visit: allergies, medications, problem list, medical history, surgical history, family history, social history, and previous encounter notes.  I, 05/27/2019, CMA, am acting as transcriptionist for Insurance claims handler, DO  I have reviewed the above documentation for accuracy and completeness, and I agree with the above. Helane Rima, DO

## 2019-07-27 NOTE — Telephone Encounter (Signed)
Please see

## 2019-07-28 ENCOUNTER — Encounter (INDEPENDENT_AMBULATORY_CARE_PROVIDER_SITE_OTHER): Payer: Self-pay | Admitting: Family Medicine

## 2019-08-01 NOTE — Telephone Encounter (Signed)
Please see

## 2019-08-17 ENCOUNTER — Ambulatory Visit (INDEPENDENT_AMBULATORY_CARE_PROVIDER_SITE_OTHER): Payer: BC Managed Care – PPO | Admitting: Family Medicine

## 2019-08-17 ENCOUNTER — Encounter (INDEPENDENT_AMBULATORY_CARE_PROVIDER_SITE_OTHER): Payer: Self-pay | Admitting: Family Medicine

## 2019-08-17 ENCOUNTER — Other Ambulatory Visit: Payer: Self-pay

## 2019-08-17 VITALS — BP 109/69 | HR 48 | Temp 98.0°F | Ht 64.0 in | Wt 209.0 lb

## 2019-08-17 DIAGNOSIS — R7303 Prediabetes: Secondary | ICD-10-CM | POA: Diagnosis not present

## 2019-08-17 DIAGNOSIS — E039 Hypothyroidism, unspecified: Secondary | ICD-10-CM

## 2019-08-17 DIAGNOSIS — Z9189 Other specified personal risk factors, not elsewhere classified: Secondary | ICD-10-CM

## 2019-08-17 DIAGNOSIS — Z6835 Body mass index (BMI) 35.0-35.9, adult: Secondary | ICD-10-CM

## 2019-08-21 NOTE — Progress Notes (Signed)
Chief Complaint:   OBESITY Janice Brown is here to discuss her progress with her obesity treatment plan along with follow-up of her obesity related diagnoses. Janice Brown is on keeping a food journal and adhering to recommended goals of 1500 calories and 95 grams of protein and states she is following her eating plan approximately 80% of the time. Janice Brown states she is swimming, strength training, and doing cardio for 30-60 minutes 5-6 times per week.  Today's visit was #: 5 Starting weight: 222 lbs Starting date: 05/25/2019 Today's weight: 209 lbs Today's date: 08/17/2019 Total lbs lost to date: 19 lbs Total lbs lost since last in-office visit: 5 lbs Total weight loss percentage to date: -5.86%  Interim History: Janice Brown says she is happy with the plan.  She loves Wegovy.  No side effects.  She says she feels "normal".  Polyphagia is controlled.  Subjective:   1. Prediabetes Janice Brown has a diagnosis of prediabetes based on her elevated HgA1c and was informed this puts her at greater risk of developing diabetes. She continues to work on diet and exercise to decrease her risk of diabetes. She denies nausea or hypoglycemia.  Lab Results  Component Value Date   HGBA1C 5.8 (H) 05/25/2019   Lab Results  Component Value Date   INSULIN 17.6 05/25/2019   2. Hypothyroidism, unspecified type Janice Brown is taking Armour Thyroid 120 mg daily.  Lab Results  Component Value Date   TSH 0.235 (L) 05/25/2019   3. At risk for constipation Janice Brown is at increased risk for constipation due to inadequate water intake, changes in diet, and/or use of medications such as GLP1 agonists. Janice Brown denies hard, infrequent stools currently.   Assessment/Plan:   1. Prediabetes Not optimized. Goal is HgbA1c < 5.7 and insulin level closer to 5. Janice Brown will continue to work on weight loss, exercise, and decreasing simple carbohydrates to help decrease the risk of diabetes.   2. Hypothyroidism,  unspecified type Patient with long-standing hypothyroidism, on levothyroxine therapy. She appears euthyroid. The current medical regimen is effective;  continue present plan and medications.  3. At risk for constipation Janice Brown was given approximately 15 minutes of counseling today regarding prevention of constipation due to [facility].Janice Brown Kitchen She was encouraged to increase water and fiber intake.   4. Class 2 severe obesity with serious comorbidity and body mass index (BMI) of 35.0 to 35.9 in adult, unspecified obesity type [facility]) Janice Brown is currently in the action stage of change. As such, her goal is to continue with weight loss efforts. She has agreed to keeping a food journal and adhering to recommended goals of 1500 calories and 95 grams of protein.   Exercise goals: As is.  Behavioral modification strategies: travel eating strategies.  Janice Brown has agreed to follow-up with our clinic in 3-4 weeks. She was informed of the importance of frequent follow-up visits to maximize her success with intensive lifestyle modifications for her multiple health conditions.   Objective:   Blood pressure 109/69, pulse (!) 48, temperature 98 F (36.7 C), temperature source Oral, height 5\' 4"  (1.626 m), weight 209 lb (94.8 kg), SpO2 97 %. Body mass index is 35.87 kg/m.  General: Cooperative, alert, well developed, in no acute distress. HEENT: Conjunctivae and lids unremarkable. Cardiovascular: Regular rhythm.  Lungs: Normal work of breathing. Neurologic: No focal deficits.   Lab Results  Component Value Date   CREATININE 0.79 05/25/2019   BUN 10 05/25/2019   NA 144 05/25/2019   K 4.8 05/25/2019   CL 105 05/25/2019  CO2 23 05/25/2019   Lab Results  Component Value Date   ALT 23 05/25/2019   AST 20 05/25/2019   ALKPHOS 111 05/25/2019   BILITOT 0.5 05/25/2019   Lab Results  Component Value Date   HGBA1C 5.8 (H) 05/25/2019   Lab Results  Component Value Date   INSULIN 17.6 05/25/2019    Lab Results  Component Value Date   TSH 0.235 (L) 05/25/2019   Lab Results  Component Value Date   CHOL 193 05/25/2019   HDL 53 05/25/2019   LDLCALC 112 (H) 05/25/2019   TRIG 163 (H) 05/25/2019   CHOLHDL 3.6 05/25/2019   Lab Results  Component Value Date   WBC 8.6 05/25/2019   HGB 14.4 05/25/2019   HCT 44.7 05/25/2019   MCV 84 05/25/2019   PLT 414 05/25/2019   Lab Results  Component Value Date   IRON 109 05/25/2019   TIBC 385 05/25/2019   FERRITIN 62 05/25/2019   Attestation Statements:   Reviewed by clinician on day of visit: allergies, medications, problem list, medical history, surgical history, family history, social history, and previous encounter notes.  I, Insurance claims handler, CMA, am acting as transcriptionist for Helane Rima, DO  I have reviewed the above documentation for accuracy and completeness, and I agree with the above. Helane Rima, DO

## 2019-08-29 ENCOUNTER — Encounter (INDEPENDENT_AMBULATORY_CARE_PROVIDER_SITE_OTHER): Payer: Self-pay | Admitting: Family Medicine

## 2019-08-29 MED ORDER — WEGOVY 0.5 MG/0.5ML ~~LOC~~ SOAJ: 0.5000 mg | SUBCUTANEOUS | 0 refills | Status: DC

## 2019-08-31 ENCOUNTER — Telehealth (INDEPENDENT_AMBULATORY_CARE_PROVIDER_SITE_OTHER): Payer: Self-pay

## 2019-08-31 NOTE — Telephone Encounter (Signed)
Spoke with pharmacy and they have ordered her medication and will be here tomorrow. Pt notified

## 2019-08-31 NOTE — Telephone Encounter (Signed)
My Chart down per patient. Patient request call from Dr Earlene Plater.  Janice Brown out of [employer]. Patient would like sample. Best # to call [phone].

## 2019-09-04 ENCOUNTER — Encounter (INDEPENDENT_AMBULATORY_CARE_PROVIDER_SITE_OTHER): Payer: Self-pay | Admitting: Family Medicine

## 2019-09-04 MED ORDER — WEGOVY 0.5 MG/0.5ML ~~LOC~~ SOAJ: 0.5000 mg | SUBCUTANEOUS | 0 refills | Status: DC

## 2019-09-04 MED FILL — WEGOVY 0.5 MG/0.5ML SOAJ: 0.5 | 28 days supply | Qty: 2 | Fill #0

## 2019-09-14 ENCOUNTER — Encounter (INDEPENDENT_AMBULATORY_CARE_PROVIDER_SITE_OTHER): Payer: Self-pay | Admitting: Family Medicine

## 2019-09-14 ENCOUNTER — Other Ambulatory Visit: Payer: Self-pay

## 2019-09-14 ENCOUNTER — Ambulatory Visit (INDEPENDENT_AMBULATORY_CARE_PROVIDER_SITE_OTHER): Payer: BC Managed Care – PPO | Admitting: Family Medicine

## 2019-09-14 VITALS — BP 117/92 | HR 57 | Temp 97.9°F | Ht 64.0 in | Wt 208.0 lb

## 2019-09-14 DIAGNOSIS — E039 Hypothyroidism, unspecified: Secondary | ICD-10-CM

## 2019-09-14 DIAGNOSIS — Z6835 Body mass index (BMI) 35.0-35.9, adult: Secondary | ICD-10-CM

## 2019-09-14 DIAGNOSIS — R632 Polyphagia: Secondary | ICD-10-CM

## 2019-09-14 MED ORDER — WEGOVY 2.4 MG/0.75ML ~~LOC~~ SOAJ: 2.4000 mg | SUBCUTANEOUS | 0 refills | Status: DC

## 2019-09-14 MED ORDER — WEGOVY 1 MG/0.5ML ~~LOC~~ SOAJ: 1.0000 mg | SUBCUTANEOUS | 0 refills | Status: DC

## 2019-09-14 MED ORDER — WEGOVY 1.7 MG/0.75ML ~~LOC~~ SOAJ: 1.7000 mg | SUBCUTANEOUS | 0 refills | Status: DC

## 2019-09-19 MED FILL — WEGOVY 1 MG/0.5ML SOAJ: 1 | 28 days supply | Qty: 2 | Fill #0

## 2019-09-19 NOTE — Progress Notes (Signed)
Chief Complaint:   OBESITY Janice Brown is here to discuss her progress with her obesity treatment plan along with follow-up of her obesity related diagnoses. Janice Brown is on keeping a food journal and adhering to recommended goals of 1500 calories and 95 grams of protein and states she is following her eating plan approximately 25-30% of the time. Janice Brown states she is swimming/doing cardio/ and walking 3-7 times per week.  Today's visit was #: 6 Starting weight: 222 lbs Starting date: 05/25/2019 Today's weight: 208 lbs Today's date: 09/14/2019 Total lbs lost to date: 14 lbs Total lbs lost since last in-office visit: 1 lb Total weight loss percentage to date: -6.31%  Interim History: Janice Brown says that her polyphagia has decreased.  She is having no side effects on Wegovy.  She has 2 more shots left (takes it every Monday).  She is using a book with vegan recipes and meals.  She has also been having vegan protein drinks.  Assessment/Plan:   1. Hypothyroidism At goal. Patient with long-standing hypothyroidism, on Armour Thyroid 120 mg daily. The current medical regimen is effective;  continue present plan and medications.  2. Janice Brown says her polyphagia has decreased.  Intensive lifestyle modifications are the first line treatment for this issue. We discussed several lifestyle modifications today and she will continue to work on diet, exercise and weight loss efforts.   3. Class 2 severe obesity with serious comorbidity and body mass index (BMI) of 35.0 to 35.9 in adult, unspecified obesity type Janice Brown, Janice Brown) Plan: Increase Wegovy due to continued polyphagia. I will go ahead and prescribe the advanced doses of the regimen in order to avoid delay in future prescriptions since there is a supply/demand issue with this medication currently.   - Semaglutide-Weight Management (WEGOVY) 1 MG/0.5ML SOAJ; Inject 0.5 mLs (1 mg total) into the skin once a week.  Dispense: 2 mL; Refill: 0 -  Semaglutide-Weight Management (WEGOVY) 1.7 MG/0.75ML SOAJ; Inject 0.75 mLs (1.7 mg total) into the skin once a week.  Dispense: 3 mL; Refill: 0 - Semaglutide-Weight Management (WEGOVY) 2.4 MG/0.75ML SOAJ; Inject 0.75 mLs (2.4 mg total) into the skin once a week.  Dispense: 3 mL; Refill: 0  Janice Brown is currently in the action stage of change. As such, her goal is to continue with weight loss efforts. She has agreed to keeping a food journal and adhering to recommended goals of 1500. calories and 95 grams of protein.   Exercise goals: For substantial health benefits, adults should do at least 150 minutes (2 hours and 30 minutes) a week of moderate-intensity, or 75 minutes (1 hour and 15 minutes) a week of vigorous-intensity aerobic physical activity, or an equivalent combination of moderate- and vigorous-intensity aerobic activity. Aerobic activity should be performed in episodes of at least 10 minutes, and preferably, it should be spread throughout the week.  Behavioral modification strategies: meal planning and cooking strategies.  Janice Brown has agreed to follow-up with our clinic in 3 weeks. She was informed of the importance of frequent follow-up visits to maximize her success with intensive lifestyle modifications for her multiple health conditions.   Objective:   Blood pressure (!) 117/92, pulse (!) 57, temperature 97.9 F (36.6 C), temperature source Oral, height 5\' 4"  (1.626 m), weight 208 lb (94.3 kg), SpO2 97 %. Body mass index is 35.7 kg/m.  General: Cooperative, alert, well developed, in no acute distress. HEENT: Conjunctivae and lids unremarkable. Cardiovascular: Regular rhythm.  Lungs: Normal work of breathing. Neurologic: No focal deficits.  Lab Results  Component Value Date   CREATININE 0.79 05/25/2019   BUN 10 05/25/2019   NA 144 05/25/2019   K 4.8 05/25/2019   CL 105 05/25/2019   CO2 23 05/25/2019   Lab Results  Component Value Date   ALT 23 05/25/2019   AST 20  05/25/2019   ALKPHOS 111 05/25/2019   BILITOT 0.5 05/25/2019   Lab Results  Component Value Date   HGBA1C 5.8 (H) 05/25/2019   Lab Results  Component Value Date   INSULIN 17.6 05/25/2019   Lab Results  Component Value Date   TSH 0.235 (L) 05/25/2019   Lab Results  Component Value Date   CHOL 193 05/25/2019   HDL 53 05/25/2019   LDLCALC 112 (H) 05/25/2019   TRIG 163 (H) 05/25/2019   CHOLHDL 3.6 05/25/2019   Lab Results  Component Value Date   WBC 8.6 05/25/2019   HGB 14.4 05/25/2019   HCT 44.7 05/25/2019   MCV 84 05/25/2019   PLT 414 05/25/2019   Lab Results  Component Value Date   IRON 109 05/25/2019   TIBC 385 05/25/2019   FERRITIN 62 05/25/2019   Attestation Statements:   Reviewed by clinician on day of visit: allergies, medications, problem list, medical history, surgical history, family history, social history, and previous encounter notes.  Time spent on visit including pre-visit chart review and post-visit care and charting was 25 minutes.   I, Insurance claims handler, CMA, am acting as transcriptionist for Helane Rima, DO  I have reviewed the above documentation for accuracy and completeness, and I agree with the above. Helane Rima, DO

## 2019-10-05 ENCOUNTER — Ambulatory Visit (INDEPENDENT_AMBULATORY_CARE_PROVIDER_SITE_OTHER): Payer: BC Managed Care – PPO | Admitting: Family Medicine

## 2019-10-05 ENCOUNTER — Encounter (INDEPENDENT_AMBULATORY_CARE_PROVIDER_SITE_OTHER): Payer: Self-pay | Admitting: Family Medicine

## 2019-10-05 ENCOUNTER — Other Ambulatory Visit: Payer: Self-pay

## 2019-10-05 VITALS — BP 115/69 | HR 53 | Temp 98.0°F | Ht 64.0 in | Wt 202.0 lb

## 2019-10-05 DIAGNOSIS — E669 Obesity, unspecified: Secondary | ICD-10-CM | POA: Diagnosis not present

## 2019-10-05 DIAGNOSIS — E038 Other specified hypothyroidism: Secondary | ICD-10-CM | POA: Diagnosis not present

## 2019-10-05 DIAGNOSIS — Z6834 Body mass index (BMI) 34.0-34.9, adult: Secondary | ICD-10-CM

## 2019-10-05 DIAGNOSIS — R632 Polyphagia: Secondary | ICD-10-CM

## 2019-10-10 NOTE — Progress Notes (Signed)
Chief Complaint:   OBESITY Janice Brown is here to discuss her progress with her obesity treatment plan along with follow-up of her obesity related diagnoses. Janice Brown is on a whole food, plant based diet, keeping a food journal and adhering to recommended goals of 1500 calories and 95 grams of protein and states she is following her eating plan approximately 60-70% of the time. Janice Brown states she is swimming/doing cardio/strength training for 30 minutes 3-5 times per week.  Today's visit was #: 7 Starting weight: 222 lbs Starting date: 05/25/2019 Today's weight: 202 lbs Today's date: 10/05/2019 Total lbs lost to date: 20 lbs Total lbs lost since last in-office visit: 6 lbs Total weight loss percentage to date: -9.01%  Interim History: Janice Brown says she is happy with the plan and medications. She feels that she finally feels that her hunger is controlled.  Current Outpatient Medications:  .  ARMOUR THYROID 120 MG tablet, Take 120 mg by mouth daily., Disp: , Rfl:  .  Cholecalciferol (D3 HIGH POTENCY) 125 MCG (5000 UT) capsule, Take 5,000 Units by mouth daily., Disp: , Rfl:  .  ferrous sulfate 325 (65 FE) MG tablet, Take 325 mg by mouth daily with breakfast., Disp: , Rfl:  .  phentermine 37.5 MG capsule, Take 37.5 mg by mouth every morning., Disp: , Rfl:  .  Semaglutide-Weight Management (WEGOVY) 1 MG/0.5ML SOAJ, Inject 0.5 mLs (1 mg total) into the skin once a week., Disp: 2 mL, Rfl: 0 .  Semaglutide-Weight Management (WEGOVY) 1.7 MG/0.75ML SOAJ, Inject 0.75 mLs (1.7 mg total) into the skin once a week., Disp: 3 mL, Rfl: 0 .  Semaglutide-Weight Management (WEGOVY) 2.4 MG/0.75ML SOAJ, Inject 0.75 mLs (2.4 mg total) into the skin once a week., Disp: 3 mL, Rfl: 0 .  vitamin B-12 (CYANOCOBALAMIN) 100 MCG tablet, Take 100 mcg by mouth daily., Disp: , Rfl:   Assessment/Plan:   1. Polyphagia The current medical regimen is effective;  continue present plan and medications.She will continue  to focus on protein-rich, low simple carbohydrate foods. We reviewed the importance of hydration, regular exercise for stress reduction, and restorative sleep.   2. Other specified hypothyroidism Idona is taking Armour Thyroid 120 mg daily. The current medical regimen is effective;  continue present plan and medications.  Lab Results  Component Value Date   TSH 0.235 (L) 05/25/2019   3. Class 1 obesity with serious comorbidity and body mass index (BMI) of 34.0 to 34.9 in adult, unspecified obesity type  Janice Brown is currently in the action stage of change. As such, her goal is to continue with weight loss efforts. She has agreed to a whole food, plant based diet, keeping a food journal and adhering to recommended goals of 1500 calories and 95 grams of protein.   Exercise goals: As is.  Behavioral modification strategies: increasing lean protein intake.  Janice Brown has agreed to follow-up with our clinic in 3-4 weeks. She was informed of the importance of frequent follow-up visits to maximize her success with intensive lifestyle modifications for her multiple health conditions.   Objective:   Blood pressure 115/69, pulse (!) 53, temperature 98 F (36.7 C), temperature source Oral, height 5\' 4"  (1.626 m), weight 202 lb (91.6 kg), SpO2 97 %. Body mass index is 34.67 kg/m.  General: Cooperative, alert, well developed, in no acute distress. HEENT: Conjunctivae and lids unremarkable. Cardiovascular: Regular rhythm.  Lungs: Normal work of breathing. Neurologic: No focal deficits.   Lab Results  Component Value Date  CREATININE 0.79 05/25/2019   BUN 10 05/25/2019   NA 144 05/25/2019   K 4.8 05/25/2019   CL 105 05/25/2019   CO2 23 05/25/2019   Lab Results  Component Value Date   ALT 23 05/25/2019   AST 20 05/25/2019   ALKPHOS 111 05/25/2019   BILITOT 0.5 05/25/2019   Lab Results  Component Value Date   HGBA1C 5.8 (H) 05/25/2019   Lab Results  Component Value Date    INSULIN 17.6 05/25/2019   Lab Results  Component Value Date   TSH 0.235 (L) 05/25/2019   Lab Results  Component Value Date   CHOL 193 05/25/2019   HDL 53 05/25/2019   LDLCALC 112 (H) 05/25/2019   TRIG 163 (H) 05/25/2019   CHOLHDL 3.6 05/25/2019   Lab Results  Component Value Date   WBC 8.6 05/25/2019   HGB 14.4 05/25/2019   HCT 44.7 05/25/2019   MCV 84 05/25/2019   PLT 414 05/25/2019   Lab Results  Component Value Date   IRON 109 05/25/2019   TIBC 385 05/25/2019   FERRITIN 62 05/25/2019   Attestation Statements:   Reviewed by clinician on day of visit: allergies, medications, problem list, medical history, surgical history, family history, social history, and previous encounter notes. Time: 20 minutes.  I, Insurance claims handler, CMA, am acting as transcriptionist for Helane Rima, DO  I have reviewed the above documentation for accuracy and completeness, and I agree with the above. Helane Rima, DO

## 2019-10-24 ENCOUNTER — Other Ambulatory Visit (INDEPENDENT_AMBULATORY_CARE_PROVIDER_SITE_OTHER): Payer: Self-pay | Admitting: Family Medicine

## 2019-10-26 ENCOUNTER — Encounter (INDEPENDENT_AMBULATORY_CARE_PROVIDER_SITE_OTHER): Payer: Self-pay | Admitting: Family Medicine

## 2019-10-26 ENCOUNTER — Other Ambulatory Visit (INDEPENDENT_AMBULATORY_CARE_PROVIDER_SITE_OTHER): Payer: Self-pay | Admitting: Family Medicine

## 2019-10-26 DIAGNOSIS — Z6835 Body mass index (BMI) 35.0-35.9, adult: Secondary | ICD-10-CM

## 2019-10-26 NOTE — Telephone Encounter (Signed)
Please review and Advise. Janice Brown, CMA

## 2019-10-27 ENCOUNTER — Encounter (INDEPENDENT_AMBULATORY_CARE_PROVIDER_SITE_OTHER): Payer: Self-pay | Admitting: Family Medicine

## 2019-10-30 ENCOUNTER — Other Ambulatory Visit (INDEPENDENT_AMBULATORY_CARE_PROVIDER_SITE_OTHER): Payer: Self-pay | Admitting: Family Medicine

## 2019-10-30 MED ORDER — WEGOVY 2.4 MG/0.75ML ~~LOC~~ SOAJ: 2.4000 mg | SUBCUTANEOUS | 0 refills | Status: DC

## 2019-10-30 MED FILL — WEGOVY 2.4 MG/0.75ML SOAJ: 2.4 | 28 days supply | Qty: 3 | Fill #0

## 2019-10-31 ENCOUNTER — Ambulatory Visit (INDEPENDENT_AMBULATORY_CARE_PROVIDER_SITE_OTHER): Payer: BC Managed Care – PPO | Admitting: Family Medicine

## 2019-11-01 ENCOUNTER — Ambulatory Visit (INDEPENDENT_AMBULATORY_CARE_PROVIDER_SITE_OTHER): Payer: BC Managed Care – PPO | Admitting: Family Medicine

## 2019-11-01 ENCOUNTER — Encounter (INDEPENDENT_AMBULATORY_CARE_PROVIDER_SITE_OTHER): Payer: Self-pay | Admitting: Family Medicine

## 2019-11-02 ENCOUNTER — Ambulatory Visit (INDEPENDENT_AMBULATORY_CARE_PROVIDER_SITE_OTHER): Payer: BC Managed Care – PPO | Admitting: Family Medicine

## 2019-11-09 ENCOUNTER — Encounter (INDEPENDENT_AMBULATORY_CARE_PROVIDER_SITE_OTHER): Payer: Self-pay | Admitting: Family Medicine

## 2019-11-09 DIAGNOSIS — R632 Polyphagia: Secondary | ICD-10-CM

## 2019-11-09 MED ORDER — PHENTERMINE HCL 37.5 MG PO TABS: 37.5000 mg | ORAL_TABLET | Freq: Every day | ORAL | 1 refills | Status: DC

## 2019-11-21 ENCOUNTER — Encounter (INDEPENDENT_AMBULATORY_CARE_PROVIDER_SITE_OTHER): Payer: Self-pay | Admitting: Family Medicine

## 2019-11-21 DIAGNOSIS — Z6835 Body mass index (BMI) 35.0-35.9, adult: Secondary | ICD-10-CM

## 2019-11-21 NOTE — Telephone Encounter (Signed)
Last OV with Dr Wallace 

## 2019-11-22 ENCOUNTER — Other Ambulatory Visit (INDEPENDENT_AMBULATORY_CARE_PROVIDER_SITE_OTHER): Payer: Self-pay | Admitting: Family Medicine

## 2019-11-22 MED ORDER — WEGOVY 2.4 MG/0.75ML ~~LOC~~ SOAJ: 2.4000 mg | SUBCUTANEOUS | 0 refills | Status: DC

## 2019-11-22 MED FILL — WEGOVY 2.4 MG/0.75ML SOAJ: 2.4 | 28 days supply | Qty: 3 | Fill #0

## 2019-11-22 NOTE — Addendum Note (Signed)
Addended by: Scarlett Presto on: 11/22/2019 11:48 AM   Modules accepted: Orders

## 2019-11-30 ENCOUNTER — Encounter (INDEPENDENT_AMBULATORY_CARE_PROVIDER_SITE_OTHER): Payer: Self-pay | Admitting: Family Medicine

## 2019-11-30 ENCOUNTER — Ambulatory Visit (INDEPENDENT_AMBULATORY_CARE_PROVIDER_SITE_OTHER): Payer: BC Managed Care – PPO | Admitting: Family Medicine

## 2019-11-30 ENCOUNTER — Other Ambulatory Visit: Payer: Self-pay

## 2019-11-30 VITALS — BP 107/72 | HR 53 | Temp 97.6°F | Ht 64.0 in | Wt 195.0 lb

## 2019-11-30 DIAGNOSIS — R632 Polyphagia: Secondary | ICD-10-CM | POA: Diagnosis not present

## 2019-11-30 DIAGNOSIS — R7303 Prediabetes: Secondary | ICD-10-CM

## 2019-11-30 DIAGNOSIS — E039 Hypothyroidism, unspecified: Secondary | ICD-10-CM

## 2019-11-30 DIAGNOSIS — E669 Obesity, unspecified: Secondary | ICD-10-CM

## 2019-11-30 DIAGNOSIS — Z6833 Body mass index (BMI) 33.0-33.9, adult: Secondary | ICD-10-CM

## 2019-12-05 NOTE — Progress Notes (Signed)
Chief Complaint:   OBESITY Janice Brown is here to discuss her progress with her obesity treatment plan along with follow-up of her obesity related diagnoses.   Today's visit was #: 8 Starting weight: 222 lbs Starting date: 05/25/2019 Today's weight: 195 lbs Today's date: 11/30/2019 Total lbs lost to date: 27 lbs Body mass index is 33.47 kg/m.  Total weight loss percentage to date: -12.16%  Interim History: Janice Brown is down 30 pounds total!  Her waist has gone from 41 to 37.5 inches.  She says she is working on protein sources.  She has increased her fiber intake.  She says doing that has been helpful for combating constipation. Nutrition Plan: keeping a food journal and adhering to recommended goals of 1500 calories and 95 grams of protein.  Anti-obesity medications: Wegovy. Reported side effects: None. Activity: Swimming, lifting weights, and Peloton for 45 minutes 6-7 times per week.  Assessment/Plan:   1. Hypothyroidism Medication: Armour Thyroid 120 mg daily. This is monitored by her PCP.   Lab Results  Component Value Date   TSH 0.235 (L) 05/25/2019   Plan: We will continue to monitor symptoms as they relate to her weight loss journey.  2. Polyphagia Controlled with [id number]. The current medical regimen is effective;  continue present plan and medications.  3. Prediabetes Stable. Goal is HgbA1c < 5.7.  Medication: LPFXTK.  She will continue to focus on protein-rich, low simple carbohydrate foods. We reviewed the importance of hydration, regular exercise for stress reduction, and restorative sleep.   Lab Results  Component Value Date   HGBA1C 5.8 (H) 05/25/2019   Lab Results  Component Value Date   INSULIN 17.6 05/25/2019   4. Class 1 obesity with serious comorbidity and body mass index (BMI) of 33.0 to 33.9 in adult, unspecified obesity type  Course: Janice Brown is currently in the action stage of change. As such, her goal is to continue with weight loss efforts.    Nutrition goals: She has agreed to keeping a food journal and adhering to recommended goals of 1500 calories and 95 grams of protein.   Exercise goals: As is.  Behavioral modification strategies: increasing lean protein intake, decreasing simple carbohydrates, increasing vegetables and increasing water intake.  Janice Brown has agreed to follow-up with our clinic in 3-4 weeks. She was informed of the importance of frequent follow-up visits to maximize her success with intensive lifestyle modifications for her multiple health conditions.   Objective:   Blood pressure 107/72, pulse (!) 53, temperature 97.6 F (36.4 C), height 5\' 4"  (1.626 m), weight 195 lb (88.5 kg), SpO2 98 %. Body mass index is 33.47 kg/m.  General: Cooperative, alert, well developed, in no acute distress. HEENT: Conjunctivae and lids unremarkable. Cardiovascular: Regular rhythm.  Lungs: Normal work of breathing. Neurologic: No focal deficits.   Lab Results  Component Value Date   CREATININE 0.79 05/25/2019   BUN 10 05/25/2019   NA 144 05/25/2019   K 4.8 05/25/2019   CL 105 05/25/2019   CO2 23 05/25/2019   Lab Results  Component Value Date   ALT 23 05/25/2019   AST 20 05/25/2019   ALKPHOS 111 05/25/2019   BILITOT 0.5 05/25/2019   Lab Results  Component Value Date   HGBA1C 5.8 (H) 05/25/2019   Lab Results  Component Value Date   INSULIN 17.6 05/25/2019   Lab Results  Component Value Date   TSH 0.235 (L) 05/25/2019   Lab Results  Component Value Date   CHOL 193 05/25/2019  HDL 53 05/25/2019   LDLCALC 112 (H) 05/25/2019   TRIG 163 (H) 05/25/2019   CHOLHDL 3.6 05/25/2019   Lab Results  Component Value Date   WBC 8.6 05/25/2019   HGB 14.4 05/25/2019   HCT 44.7 05/25/2019   MCV 84 05/25/2019   PLT 414 05/25/2019   Lab Results  Component Value Date   IRON 109 05/25/2019   TIBC 385 05/25/2019   FERRITIN 62 05/25/2019   Attestation Statements:   Reviewed by clinician on day of visit:  allergies, medications, problem list, medical history, surgical history, family history, social history, and previous encounter notes.  I, Insurance claims handler, CMA, am acting as transcriptionist for Helane Rima, DO  I have reviewed the above documentation for accuracy and completeness, and I agree with the above. Helane Rima, DO

## 2019-12-26 ENCOUNTER — Telehealth (INDEPENDENT_AMBULATORY_CARE_PROVIDER_SITE_OTHER): Payer: Self-pay

## 2019-12-26 NOTE — Telephone Encounter (Signed)
[employer] is calling regarding a prior auth that was denied.  Is asking about an appeal.

## 2019-12-26 NOTE — Telephone Encounter (Signed)
Spoke with representative and we assume this call was about the [facility] and no PA or appeal needed at this time due to savings card.

## 2019-12-27 ENCOUNTER — Encounter (INDEPENDENT_AMBULATORY_CARE_PROVIDER_SITE_OTHER): Payer: Self-pay | Admitting: Family Medicine

## 2019-12-27 ENCOUNTER — Other Ambulatory Visit: Payer: Self-pay

## 2019-12-27 ENCOUNTER — Ambulatory Visit (INDEPENDENT_AMBULATORY_CARE_PROVIDER_SITE_OTHER): Payer: BC Managed Care – PPO | Admitting: Family Medicine

## 2019-12-27 VITALS — BP 108/70 | HR 49 | Temp 97.9°F | Ht 64.0 in | Wt 195.0 lb

## 2019-12-27 DIAGNOSIS — E038 Other specified hypothyroidism: Secondary | ICD-10-CM

## 2019-12-27 DIAGNOSIS — R632 Polyphagia: Secondary | ICD-10-CM | POA: Diagnosis not present

## 2019-12-27 DIAGNOSIS — E669 Obesity, unspecified: Secondary | ICD-10-CM | POA: Diagnosis not present

## 2019-12-27 DIAGNOSIS — Z6833 Body mass index (BMI) 33.0-33.9, adult: Secondary | ICD-10-CM | POA: Diagnosis not present

## 2019-12-27 DIAGNOSIS — Z9189 Other specified personal risk factors, not elsewhere classified: Secondary | ICD-10-CM | POA: Diagnosis not present

## 2019-12-27 MED ORDER — WEGOVY 1 MG/0.5ML ~~LOC~~ SOAJ: 1.0000 mg | SUBCUTANEOUS | 0 refills | Status: DC

## 2019-12-27 NOTE — Progress Notes (Signed)
Chief Complaint:   OBESITY Janice Brown is here to discuss her progress with her obesity treatment plan along with follow-up of her obesity related diagnoses.   Today's visit was #: 9 Starting weight: 222 lbs Starting date: 05/25/2019 Today's weight: 195 lbs Today's date: 12/27/2019 Total lbs lost to date: 27 lbs Body mass index is 33.47 kg/m.  Total weight loss percentage to date: -12.16%  Interim History: [name] says that the [facility] only has 1 mg Wegovy pens in stock.  She says she is getting in less protein; has been more lax lately.  She is happy that she has maintained. Nutrition Plan: keeping a food journal and adhering to recommended goals of 1500 calories and 95 grams of protein for 50% of the time.  Anti-obesity medications: Wegovy. Reported side effects: None. Hunger is well controlled. Cravings are well controlled.  Activity: Swimming/cardio/strength training for 45 minutes 5-6 times per week.  Assessment/Plan:   1. Other specified hypothyroidism Medication: Armour Thyroid 120 mg daily.   Lab Results  Component Value Date   TSH 0.235 (L) 05/25/2019   Plan: Patient was instructed not to take MVM or iron within 4 hours of taking thyroid medications. We will continue to monitor symptoms as they relate to her weight loss journey.  2. Polyphagia Improved with treatment. Hyperphagia, also called polyphagia, refers to excessive feelings of hunger. This is more likely to be an issues for people that have diabetes, prediabetes, or insulin resistance. She will continue to focus on protein-rich, low simple carbohydrate foods. We reviewed the importance of hydration, regular exercise for stress reduction, and restorative sleep.  Plan:  Will refill Wegovy today and call pharmacy to check dosage available.  3. At risk for deficient intake of food [name] was given approximately 10 minutes of deficit intake of food prevention counseling today. [name] is at risk  for eating too few calories based on current food recall. She was encouraged to focus on meeting caloric and protein goals according to her recommended meal plan.  [name] will increase her protein intake.  4. Class 1 obesity with serious comorbidity and body mass index (BMI) of 33.0 to 33.9 in adult, unspecified obesity type  Course: [name] is currently in the action stage of change. As such, her goal is to continue with weight loss efforts.   Nutrition goals: She has agreed to keeping a food journal and adhering to recommended goals of 1500 calories and 95 grams of protein.   Exercise goals: For substantial health benefits, adults should do at least 150 minutes (2 hours and 30 minutes) a week of moderate-intensity, or 75 minutes (1 hour and 15 minutes) a week of vigorous-intensity aerobic physical activity, or an equivalent combination of moderate- and vigorous-intensity aerobic activity. Aerobic activity should be performed in episodes of at least 10 minutes, and preferably, it should be spread throughout the week.  Behavioral modification strategies: increasing lean protein intake, decreasing simple carbohydrates, increasing vegetables, increasing water intake, avoiding temptations, planning for success, keeping a strict food journal and decreasing junk food.  [name] has agreed to follow-up with our clinic in 4 weeks. She was informed of the importance of frequent follow-up visits to maximize her success with intensive lifestyle modifications for her multiple health conditions.   Objective:   Blood pressure 108/70, pulse (!) 49, temperature 97.9 F (36.6 C), temperature source Oral, height 5\' 4"  (1.626 m), weight 195 lb (88.5 kg), SpO2 97 %. Body mass index is 33.47 kg/m.  General: Cooperative,  alert, well developed, in no acute distress. HEENT: Conjunctivae and lids unremarkable. Cardiovascular: Regular rhythm.  Lungs: Normal work of breathing. Neurologic: No focal deficits.    Lab Results  Component Value Date   CREATININE 0.79 05/25/2019   BUN 10 05/25/2019   NA 144 05/25/2019   K 4.8 05/25/2019   CL 105 05/25/2019   CO2 23 05/25/2019   Lab Results  Component Value Date   ALT 23 05/25/2019   AST 20 05/25/2019   ALKPHOS 111 05/25/2019   BILITOT 0.5 05/25/2019   Lab Results  Component Value Date   HGBA1C 5.8 (H) 05/25/2019   Lab Results  Component Value Date   INSULIN 17.6 05/25/2019   Lab Results  Component Value Date   TSH 0.235 (L) 05/25/2019   Lab Results  Component Value Date   CHOL 193 05/25/2019   HDL 53 05/25/2019   LDLCALC 112 (H) 05/25/2019   TRIG 163 (H) 05/25/2019   CHOLHDL 3.6 05/25/2019   Lab Results  Component Value Date   WBC 8.6 05/25/2019   HGB 14.4 05/25/2019   HCT 44.7 05/25/2019   MCV 84 05/25/2019   PLT 414 05/25/2019   Lab Results  Component Value Date   IRON 109 05/25/2019   TIBC 385 05/25/2019   FERRITIN 62 05/25/2019   Attestation Statements:   Reviewed by clinician on day of visit: allergies, medications, problem list, medical history, surgical history, family history, social history, and previous encounter notes.  I, Insurance claims handler, CMA, am acting as transcriptionist for Helane Rima, DO  I have reviewed the above documentation for accuracy and completeness, and I agree with the above. Helane Rima, DO

## 2019-12-28 ENCOUNTER — Encounter (INDEPENDENT_AMBULATORY_CARE_PROVIDER_SITE_OTHER): Payer: Self-pay | Admitting: Family Medicine

## 2019-12-28 MED FILL — WEGOVY 2.4 MG/0.75ML SOAJ: 2.4 | 28 days supply | Qty: 3 | Fill #1

## 2019-12-29 MED ORDER — WEGOVY 2.4 MG/0.75ML ~~LOC~~ SOAJ: 2.4000 mg | SUBCUTANEOUS | 2 refills | Status: DC

## 2020-01-14 ENCOUNTER — Encounter (INDEPENDENT_AMBULATORY_CARE_PROVIDER_SITE_OTHER): Payer: Self-pay | Admitting: Family Medicine

## 2020-01-17 ENCOUNTER — Encounter (INDEPENDENT_AMBULATORY_CARE_PROVIDER_SITE_OTHER): Payer: Self-pay | Admitting: Family Medicine

## 2020-01-17 MED ORDER — WEGOVY 2.4 MG/0.75ML ~~LOC~~ SOAJ: 2.4000 mg | SUBCUTANEOUS | 0 refills | Status: DC

## 2020-01-18 ENCOUNTER — Encounter (INDEPENDENT_AMBULATORY_CARE_PROVIDER_SITE_OTHER): Payer: Self-pay | Admitting: Family Medicine

## 2020-01-22 ENCOUNTER — Encounter (INDEPENDENT_AMBULATORY_CARE_PROVIDER_SITE_OTHER): Payer: Self-pay | Admitting: Family Medicine

## 2020-01-22 NOTE — Telephone Encounter (Signed)
Last OV with Dr Wallace 

## 2020-01-30 MED FILL — WEGOVY 2.4 MG/0.75ML SOAJ: 2.4 | 28 days supply | Qty: 3 | Fill #2

## 2020-01-31 ENCOUNTER — Telehealth (INDEPENDENT_AMBULATORY_CARE_PROVIDER_SITE_OTHER): Payer: BC Managed Care – PPO | Admitting: Family Medicine

## 2020-01-31 ENCOUNTER — Other Ambulatory Visit: Payer: Self-pay

## 2020-01-31 ENCOUNTER — Encounter (INDEPENDENT_AMBULATORY_CARE_PROVIDER_SITE_OTHER): Payer: Self-pay | Admitting: Family Medicine

## 2020-01-31 DIAGNOSIS — E039 Hypothyroidism, unspecified: Secondary | ICD-10-CM | POA: Diagnosis not present

## 2020-01-31 DIAGNOSIS — R632 Polyphagia: Secondary | ICD-10-CM

## 2020-01-31 DIAGNOSIS — R7303 Prediabetes: Secondary | ICD-10-CM | POA: Diagnosis not present

## 2020-01-31 DIAGNOSIS — E669 Obesity, unspecified: Secondary | ICD-10-CM

## 2020-01-31 DIAGNOSIS — Z6832 Body mass index (BMI) 32.0-32.9, adult: Secondary | ICD-10-CM

## 2020-01-31 MED ORDER — WEGOVY 2.4 MG/0.75ML ~~LOC~~ SOAJ: 2.4000 mg | SUBCUTANEOUS | 2 refills | Status: DC

## 2020-02-01 NOTE — Progress Notes (Signed)
TeleHealth Visit:  Due to the COVID-19 pandemic, this visit was completed with telemedicine (audio/video) technology to reduce patient and provider exposure as well as to preserve personal protective equipment.   Janice Brown has verbally consented to this TeleHealth visit. The patient is located at home, the provider is located at the [facility] office. The participants in this visit include the listed provider and patient and. The visit was conducted today via MyChart.  OBESITY Janice Brown is here to discuss her progress with her obesity treatment plan along with follow-up of her obesity related diagnoses.   Today's visit was #: 10 Starting weight: 222 lbs Starting date: 05/25/2019 Today's weight: 192 lbs Today's date: 01/31/2020 Total lbs lost to date: 30 lbs Total weight loss percentage to date: -13.51%  Interim History: Janice Brown says she feels like she is eating a normal diet.  Her weight lifting routine is 3 days per week at home now.  She says she has been trying to swim 3 days per week as well.  At home today, she weighs 190 pounds.  Her BMI is down to 32.  Janice Brown was approved by her insurance, she reports.  Nutrition Plan: keeping a food journal and adhering to recommended goals of 1500 calories and 95 grams of protein.  Anti-obesity medications: Wegovy 2.4 mg subcutaneously weekly. Reported side effects: None. Hunger is well controlled. Cravings are well controlled.  Activity: Swimming/cardio for 30 minutes 3-4 times per week.  Assessment/Plan:   1. Polyphagia Improved. She will continue to focus on protein-rich, low simple carbohydrate foods. We reviewed the importance of hydration, regular exercise for stress reduction, and restorative sleep.  - Refill Semaglutide-Weight Management (WEGOVY) 2.4 MG/0.75ML SOAJ; Inject 2.4 mg into the skin once a week.  Dispense: 9 mL; Refill: 2  2. Hypothyroidism Medication: Armour Thyroid 120 mg daily.   Lab Results   Component Value Date   TSH 0.235 (L) 05/25/2019   Plan: We will continue to monitor symptoms as they relate to her weight loss journey.  3. Prediabetes Improving, but not optimized. Goal is HgbA1c < 5.7.  She will continue to focus on protein-rich, low simple carbohydrate foods. We reviewed the importance of hydration, regular exercise for stress reduction, and restorative sleep.   Lab Results  Component Value Date   HGBA1C 5.8 (H) 05/25/2019   Lab Results  Component Value Date   INSULIN 17.6 05/25/2019   4. Class 1 obesity with serious comorbidity and body mass index (BMI) of 32.0 to 32.9 in adult, unspecified obesity type  Course: Janice Brown is currently in the action stage of change. As such, her goal is to continue with weight loss efforts.   Nutrition goals: She has agreed to keeping a food journal and adhering to recommended goals of 1500 calories and 95 grams of protein.   Exercise goals: For substantial health benefits, adults should do at least 150 minutes (2 hours and 30 minutes) a week of moderate-intensity, or 75 minutes (1 hour and 15 minutes) a week of vigorous-intensity aerobic physical activity, or an equivalent combination of moderate- and vigorous-intensity aerobic activity. Aerobic activity should be performed in episodes of at least 10 minutes, and preferably, it should be spread throughout the week.  Behavioral modification strategies: increasing lean protein intake, decreasing simple carbohydrates, increasing vegetables, increasing water intake and decreasing liquid calories.  Janice Brown has agreed to follow-up with our clinic in 3 weeks. She was informed of the importance of frequent follow-up visits to maximize her success  with intensive lifestyle modifications for her multiple health conditions.   Objective:   There were no vitals taken for this visit. There is no height or weight on file to calculate BMI.  General: Cooperative, alert, well developed, in no  acute distress. HEENT: Conjunctivae and lids unremarkable. Cardiovascular: Regular rhythm.  Lungs: Normal work of breathing. Neurologic: No focal deficits.   Lab Results  Component Value Date   CREATININE 0.79 05/25/2019   BUN 10 05/25/2019   NA 144 05/25/2019   K 4.8 05/25/2019   CL 105 05/25/2019   CO2 23 05/25/2019   Lab Results  Component Value Date   ALT 23 05/25/2019   AST 20 05/25/2019   ALKPHOS 111 05/25/2019   BILITOT 0.5 05/25/2019   Lab Results  Component Value Date   HGBA1C 5.8 (H) 05/25/2019   Lab Results  Component Value Date   INSULIN 17.6 05/25/2019   Lab Results  Component Value Date   TSH 0.235 (L) 05/25/2019   Lab Results  Component Value Date   CHOL 193 05/25/2019   HDL 53 05/25/2019   LDLCALC 112 (H) 05/25/2019   TRIG 163 (H) 05/25/2019   CHOLHDL 3.6 05/25/2019   Lab Results  Component Value Date   WBC 8.6 05/25/2019   HGB 14.4 05/25/2019   HCT 44.7 05/25/2019   MCV 84 05/25/2019   PLT 414 05/25/2019   Lab Results  Component Value Date   IRON 109 05/25/2019   TIBC 385 05/25/2019   FERRITIN 62 05/25/2019   Attestation Statements:   Reviewed by clinician on day of visit: allergies, medications, problem list, medical history, surgical history, family history, social history, and previous encounter notes.  I, Insurance claims handler, CMA, am acting as transcriptionist for Helane Rima, DO  I have reviewed the above documentation for accuracy and completeness, and I agree with the above. Helane Rima, DO

## 2020-02-26 ENCOUNTER — Ambulatory Visit (INDEPENDENT_AMBULATORY_CARE_PROVIDER_SITE_OTHER): Payer: BC Managed Care – PPO | Admitting: Family Medicine

## 2020-02-26 ENCOUNTER — Encounter (INDEPENDENT_AMBULATORY_CARE_PROVIDER_SITE_OTHER): Payer: Self-pay | Admitting: Family Medicine

## 2020-02-26 ENCOUNTER — Other Ambulatory Visit: Payer: Self-pay

## 2020-02-26 VITALS — BP 100/67 | HR 48 | Temp 97.7°F | Ht 64.0 in | Wt 187.0 lb

## 2020-02-26 DIAGNOSIS — Z6832 Body mass index (BMI) 32.0-32.9, adult: Secondary | ICD-10-CM

## 2020-02-26 DIAGNOSIS — R632 Polyphagia: Secondary | ICD-10-CM

## 2020-02-26 DIAGNOSIS — E038 Other specified hypothyroidism: Secondary | ICD-10-CM | POA: Diagnosis not present

## 2020-02-26 DIAGNOSIS — E669 Obesity, unspecified: Secondary | ICD-10-CM | POA: Diagnosis not present
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
## 2020-02-28 ENCOUNTER — Other Ambulatory Visit (INDEPENDENT_AMBULATORY_CARE_PROVIDER_SITE_OTHER): Payer: Self-pay | Admitting: Family Medicine

## 2020-02-28 ENCOUNTER — Encounter (INDEPENDENT_AMBULATORY_CARE_PROVIDER_SITE_OTHER): Payer: Self-pay

## 2020-02-28 DIAGNOSIS — R632 Polyphagia: Secondary | ICD-10-CM

## 2020-02-28 NOTE — Telephone Encounter (Signed)
Message sent to pt.

## 2020-02-29 ENCOUNTER — Other Ambulatory Visit (INDEPENDENT_AMBULATORY_CARE_PROVIDER_SITE_OTHER): Payer: Self-pay | Admitting: Family Medicine

## 2020-02-29 MED FILL — WEGOVY 2.4 MG/0.75ML SOAJ: 2.4 | 28 days supply | Qty: 3 | Fill #0

## 2020-02-29 NOTE — Progress Notes (Signed)
Chief Complaint:   OBESITY Janice Brown is here to discuss her progress with her obesity treatment plan along with follow-up of her obesity related diagnoses.   Today's visit was #: 11 Starting weight: 222 lbs Starting date: 05/25/2019 Today's weight: 187 lbs Today's date: 02/26/2020 Total lbs lost to date: 35 lbs Body mass index is 33.81 kg/m.  Total weight loss percentage to date: -15.77%  Interim History: Janice Brown says she has been working with a Scientific laboratory technician.  Tracking has been helpful for her.  She is interested in [employer] help with mindful eating.  Nutrition Plan: keeping a food journal and adhering to recommended goals of 1500 calories and 95 grams of protein for 85% of the time. Anti-obesity medications: Wegovy. Reported side effects: None. Activity: Cardio/strength training/swimming for 60+ minutes 4-5 times per week.  Assessment/Plan:   1. Polyphagia Current treatment: Wegovy 2.4 mg subcutaneously weekly. Polyphagia refers to excessive feelings of hunger.  Plan:  Continue [id number].  She will continue to focus on protein-rich, low simple carbohydrate foods. We reviewed the importance of hydration, regular exercise for stress reduction, and restorative sleep.  2. Other specified hypothyroidism Medication: Armour Thyroid.   Plan:  Patient was instructed not to take MVM or iron within 4 hours of taking thyroid medications. This issue is managed by her PCP. We will continue to monitor alongside Endocrinology/PCP as it relates to her weight loss journey.   Lab Results  Component Value Date   TSH 0.235 (L) 05/25/2019   3. Class 1 obesity with serious comorbidity and body mass index (BMI) of 32.0 to 32.9 in adult, unspecified obesity type  Course: Janice Brown is currently in the action stage of change. As such, her goal is to continue with weight loss efforts.   Nutrition goals: She has agreed to keeping a food journal and adhering to recommended goals of 1500  calories and 95 grams of protein.   Exercise goals: For substantial health benefits, adults should do at least 150 minutes (2 hours and 30 minutes) a week of moderate-intensity, or 75 minutes (1 hour and 15 minutes) a week of vigorous-intensity aerobic physical activity, or an equivalent combination of moderate- and vigorous-intensity aerobic activity. Aerobic activity should be performed in episodes of at least 10 minutes, and preferably, it should be spread throughout the week.  Behavioral modification strategies: increasing lean protein intake, decreasing simple carbohydrates, increasing vegetables and increasing water intake.  Janice Brown has agreed to follow-up with our clinic in 4 weeks. She was informed of the importance of frequent follow-up visits to maximize her success with intensive lifestyle modifications for her multiple health conditions.   Objective:   Blood pressure 100/67, pulse (!) 48, temperature 97.7 F (36.5 C), temperature source Oral, height 5\' 4"  (1.626 m), weight 197 lb (89.4 kg), SpO2 98 %. Body mass index is 33.81 kg/m.  General: Cooperative, alert, well developed, in no acute distress. HEENT: Conjunctivae and lids unremarkable. Cardiovascular: Regular rhythm.  Lungs: Normal work of breathing. Neurologic: No focal deficits.   Lab Results  Component Value Date   CREATININE 0.79 05/25/2019   BUN 10 05/25/2019   NA 144 05/25/2019   K 4.8 05/25/2019   CL 105 05/25/2019   CO2 23 05/25/2019   Lab Results  Component Value Date   ALT 23 05/25/2019   AST 20 05/25/2019   ALKPHOS 111 05/25/2019   BILITOT 0.5 05/25/2019   Lab Results  Component Value Date   HGBA1C 5.8 (H) 05/25/2019   Lab Results  Component Value Date   INSULIN 17.6 05/25/2019   Lab Results  Component Value Date   TSH 0.235 (L) 05/25/2019   Lab Results  Component Value Date   CHOL 193 05/25/2019   HDL 53 05/25/2019   LDLCALC 112 (H) 05/25/2019   TRIG 163 (H) 05/25/2019   CHOLHDL  3.6 05/25/2019   Lab Results  Component Value Date   WBC 8.6 05/25/2019   HGB 14.4 05/25/2019   HCT 44.7 05/25/2019   MCV 84 05/25/2019   PLT 414 05/25/2019   Lab Results  Component Value Date   IRON 109 05/25/2019   TIBC 385 05/25/2019   FERRITIN 62 05/25/2019   Attestation Statements:   Reviewed by clinician on day of visit: allergies, medications, problem list, medical history, surgical history, family history, social history, and previous encounter notes.  I, Insurance claims handler, CMA, am acting as transcriptionist for Helane Rima, DO  I have reviewed the above documentation for accuracy and completeness, and I agree with the above. Helane Rima, DO

## 2020-03-05 ENCOUNTER — Telehealth (INDEPENDENT_AMBULATORY_CARE_PROVIDER_SITE_OTHER): Payer: BC Managed Care – PPO | Admitting: Psychology

## 2020-03-05 DIAGNOSIS — F5089 Other specified eating disorder: Secondary | ICD-10-CM | POA: Diagnosis not present

## 2020-03-05 NOTE — Progress Notes (Signed)
  Office: [phone]  /  Fax: [phone]    Date: March 19, 2020   Appointment Start Time: 2:00pm Duration: 32 minutes Provider: Lawerance Cruel, Psy.D. Type of Session: Individual Therapy  Location of Patient: Home Location of Provider: Provider's Home (private office) Type of Contact: Telepsychological Visit via MyChart Video Visit  Session Content: Janice Brown is a 60 y.o. female presenting for a follow-up appointment to address the previously established treatment goal of increasing coping skills. Today's appointment was a telepsychological visit due to COVID-19. [name] provided verbal consent for today's telepsychological appointment and she is aware she is responsible for securing confidentiality on her end of the session. Prior to proceeding with today's appointment, [name]'s physical location at the time of this appointment was obtained as well a phone number she could be reached at in the event of technical difficulties. [name] and this provider participated in today's telepsychological service.   This provider conducted a brief check-in. Torra shared about using the previously shared handout. Positive reinforcement was provided. Psychoeducation regarding triggers for emotional eating was provided. [name] was provided a handout, and encouraged to utilize the handout between now and the next appointment to increase awareness of triggers and frequency. [name] agreed. This provider also discussed behavioral strategies for specific triggers, such as placing the utensil down when conversing to avoid mindless eating. [name] provided verbal consent during today's appointment for this provider to send a handout about triggers via e-mail. Additionally, this provider encouraged [name] to think about other activities she could engage in the evenings to help cope with out of habit eating. [name] was receptive to today's appointment as evidenced by openness to sharing,  responsiveness to feedback, and willingness to explore triggers for emotional eating.   Mental Status Examination:  Appearance: well groomed and appropriate hygiene  Behavior: appropriate to circumstances Mood: euthymic Affect: mood congruent Speech: normal in rate, volume, and tone Eye Contact: appropriate Psychomotor Activity: unable to assess  Gait: unable to assess Thought Process: linear, logical, and goal directed  Thought Content/Perception: no hallucinations, delusions, bizarre thinking or behavior reported or observed and no evidence of suicidal and homicidal ideation, plan, and intent Orientation: time, person, place, and purpose of appointment Memory/Concentration: memory, attention, language, and fund of knowledge intact  Insight/Judgment: good  Interventions:  Conducted a brief chart review Provided empathic reflections and validation Reviewed content from the previous session Provided positive reinforcement Employed supportive psychotherapy interventions to facilitate reduced distress and to improve coping skills with identified stressors Psychoeducation provided regarding triggers for emotional eating  DSM-5 Diagnosis(es): 307.59 (F50.8) Other Specified Feeding or Eating Disorder, Emotional Eating Behaviors  Treatment Goal & Progress: During the initial appointment with this provider, the following treatment goal was established: increase coping skills. [name] has demonstrated progress in her goal as evidenced by increased awareness of hunger patterns.   Plan: The next appointment will be scheduled in two weeks, which will be via MyChart Video Visit. The next session will focus on working towards the established treatment goal.

## 2020-03-19 ENCOUNTER — Telehealth (INDEPENDENT_AMBULATORY_CARE_PROVIDER_SITE_OTHER): Payer: BC Managed Care – PPO | Admitting: Psychology

## 2020-03-19 DIAGNOSIS — F5089 Other specified eating disorder: Secondary | ICD-10-CM | POA: Diagnosis not present

## 2020-03-19 NOTE — Progress Notes (Unsigned)
Office: [phone]  /  Fax: [phone]    Date: April 02, 2020   Appointment Start Time: *** Duration: *** minutes Brown: Lawerance Cruel, Psy.D. Type of Session: Individual Therapy  Location of Patient: {gbptloc:23249} Location of Brown: Provider's Home (private office) Type of Contact: Telepsychological Visit via MyChart Video Visit  Session Content: Janice Brown is a 60 y.o. female presenting for a follow-up appointment to address the previously established treatment goal of increasing coping skills. Today's appointment was a telepsychological visit due to COVID-19. [place] provided verbal consent for today's telepsychological appointment and she is aware she is responsible for securing confidentiality on her end of the session. Prior to proceeding with today's appointment, Janice Brown's physical location at the time of this appointment was obtained as well a phone number she could be reached at in the event of technical difficulties. Janice Brown participated in today's telepsychological service.   This Brown conducted a brief check-in and verbally administered the PHQ-9 and GAD-7. ***Psychoeducation regarding mindfulness was provided. A handout was provided to [facility] with further information regarding mindfulness, including exercises. This Brown also explained the benefit of mindfulness as it relates to emotional eating. Janice Brown was encouraged to engage in the provided exercises between now and the next appointment with this Brown. Janice Brown agreed. During today's appointment, Janice Brown was led through a mindfulness exercise involving her senses. Janice Brown provided verbal consent during today's appointment for this Brown to send *** via e-mail.  Janice Brown was receptive to today's appointment as evidenced by openness to sharing, responsiveness to feedback, and {gbreceptiveness:23401}.  Mental Status Examination:  Appearance: {Appearance:22431} Behavior:  {Behavior:22445} Mood: {gbmood:21757} Affect: {Affect:22436} Speech: {Speech:22432} Eye Contact: {Eye Contact:22433} Psychomotor Activity: {Motor Activity:22434} Gait: {gbgait:23404} Thought Process: {thought process:22448}  Thought Content/Perception: {disturbances:22451} Orientation: {Orientation:22437} Memory/Concentration: {gbcognition:22449} Insight/Judgment: {Insight:22446}  Structured Assessments Results: The Patient Health Questionnaire-9 (PHQ-9) is a self-report measure that assesses symptoms and severity of depression over the course of the last two weeks. Janice Brown obtained a score of *** suggesting {GBPHQ9SEVERITY:21752}. Janice Brown finds the endorsed symptoms to be {gbphq9difficulty:21754}. [0= Not at all; 1= Several days; 2= More than half the days; 3= Nearly every day] Little interest or pleasure in doing things ***  Feeling down, depressed, or hopeless ***  Trouble falling or staying asleep, or sleeping too much ***  Feeling tired or having little energy ***  Poor appetite or overeating ***  Feeling bad about yourself --- or that you are a failure or have let yourself or your family down ***  Trouble concentrating on things, such as reading the newspaper or watching television ***  Moving or speaking so slowly that other people could have noticed? Or the opposite --- being so fidgety or restless that you have been moving around a lot more than usual ***  Thoughts that you would be better off dead or hurting yourself in some way ***  PHQ-9 Score ***    The Generalized Anxiety Disorder-7 (GAD-7) is a brief self-report measure that assesses symptoms of anxiety over the course of the last two weeks. Janice Brown obtained a score of *** suggesting {gbgad7severity:21753}. Janice Brown finds the endorsed symptoms to be {gbphq9difficulty:21754}. [0= Not at all; 1= Several days; 2= Over half the days; 3= Nearly every day] Feeling nervous, anxious, on edge ***  Not being able to stop or  control worrying ***  Worrying too much about different things ***  Trouble relaxing ***  Being so restless that it's hard to sit still ***  Becoming easily annoyed or irritable ***  Feeling afraid as if something awful might happen ***  GAD-7 Score ***   Interventions:  {Interventions for Progress Notes:23405}  DSM-5 Diagnosis(es): 307.59 (F50.8) Other Specified Feeding or Eating Disorder, Emotional Eating Behaviors  Treatment Goal & Progress: During the initial appointment with this Brown, the following treatment goal was established: increase coping skills. Janice Brown has demonstrated progress in her goal as evidenced by {gbtxprogress:22839}. Janice Brown also {gbtxprogress2:22951}.  Plan: The next appointment will be scheduled in {gbweeks:21758}, which will be {gbtxmodality:23402}. The next session will focus on {Plan for Next Appointment:23400}.

## 2020-03-21 ENCOUNTER — Encounter (INDEPENDENT_AMBULATORY_CARE_PROVIDER_SITE_OTHER): Payer: Self-pay | Admitting: Family Medicine

## 2020-03-24 MED ORDER — PHENTERMINE HCL 37.5 MG PO TABS: 37.5000 mg | ORAL_TABLET | Freq: Every day | ORAL | 0 refills | Status: DC

## 2020-03-28 ENCOUNTER — Other Ambulatory Visit (INDEPENDENT_AMBULATORY_CARE_PROVIDER_SITE_OTHER): Payer: Self-pay | Admitting: Family Medicine

## 2020-03-28 DIAGNOSIS — R632 Polyphagia: Secondary | ICD-10-CM

## 2020-03-28 NOTE — Telephone Encounter (Signed)
DUPLICATE REQUEST Will address after other is responded too

## 2020-03-28 NOTE — Telephone Encounter (Signed)
Last OV with Dr Wallace 

## 2020-03-28 NOTE — Telephone Encounter (Signed)
Pt has OV Monday, do you want to wait till then to fill?

## 2020-03-29 ENCOUNTER — Other Ambulatory Visit (INDEPENDENT_AMBULATORY_CARE_PROVIDER_SITE_OTHER): Payer: Self-pay | Admitting: Family Medicine

## 2020-03-29 DIAGNOSIS — R632 Polyphagia: Secondary | ICD-10-CM

## 2020-03-30 ENCOUNTER — Other Ambulatory Visit (INDEPENDENT_AMBULATORY_CARE_PROVIDER_SITE_OTHER): Payer: Self-pay | Admitting: Family Medicine

## 2020-03-30 MED ORDER — WEGOVY 2.4 MG/0.75ML ~~LOC~~ SOAJ: 2.4000 mg | SUBCUTANEOUS | 0 refills | Status: DC

## 2020-04-01 ENCOUNTER — Encounter (INDEPENDENT_AMBULATORY_CARE_PROVIDER_SITE_OTHER): Payer: Self-pay | Admitting: Family Medicine

## 2020-04-01 ENCOUNTER — Other Ambulatory Visit: Payer: Self-pay

## 2020-04-01 ENCOUNTER — Other Ambulatory Visit (INDEPENDENT_AMBULATORY_CARE_PROVIDER_SITE_OTHER): Payer: Self-pay | Admitting: Family Medicine

## 2020-04-01 ENCOUNTER — Ambulatory Visit (INDEPENDENT_AMBULATORY_CARE_PROVIDER_SITE_OTHER): Payer: BC Managed Care – PPO | Admitting: Family Medicine

## 2020-04-01 VITALS — BP 111/69 | HR 49 | Temp 97.6°F | Ht 64.0 in | Wt 186.0 lb

## 2020-04-01 DIAGNOSIS — R632 Polyphagia: Secondary | ICD-10-CM | POA: Diagnosis not present

## 2020-04-01 DIAGNOSIS — E669 Obesity, unspecified: Secondary | ICD-10-CM

## 2020-04-01 DIAGNOSIS — R0602 Shortness of breath: Secondary | ICD-10-CM | POA: Diagnosis not present

## 2020-04-01 DIAGNOSIS — Z6831 Body mass index (BMI) 31.0-31.9, adult: Secondary | ICD-10-CM

## 2020-04-01 DIAGNOSIS — E038 Other specified hypothyroidism: Secondary | ICD-10-CM | POA: Diagnosis not present

## 2020-04-01 MED ORDER — WEGOVY 2.4 MG/0.75ML ~~LOC~~ SOAJ: 2.4000 mg | SUBCUTANEOUS | 0 refills | Status: DC

## 2020-04-01 MED FILL — WEGOVY 2.4 MG/0.75ML SOAJ: 2.4 | 28 days supply | Qty: 3 | Fill #0

## 2020-04-01 NOTE — Progress Notes (Signed)
Chief Complaint:   OBESITY Janice Brown is here to discuss her progress with her obesity treatment plan along with follow-up of her obesity related diagnoses.   Today's visit was #: 12 Starting weight: 222 lbs Starting date: 05/25/2019 Today's weight: 186 lbs Today's date: 04/01/2020 Total lbs lost to date: 36 lbs Body mass index is 31.93 kg/m.  Total weight loss percentage to date: -16.22%  Interim History:  Janice Brown says she is frustrated with the lack of scale movement.  She has increased her exercise.  Her hunger has increased slightly.    Medication changes:  Armour Thyroid decreased by PCP to 90-120 mg alternating.  Current Meal Plan: keeping a food journal and adhering to recommended goals of 1500 calories and 95 grams of protein for 67% of the time.  Current Exercise Plan: Swimming/strength training/cardio for 60+ minutes 6 times per week. Current Anti-Obesity Medications: Wegovy 2.4 mg subcutaneously weekly and phentermine 37.5 mg daily. Side effects: None.  Assessment/Plan:   1. Polyphagia Not at goal.  Current treatment: phentermine 37.5 mg daily, Wegovy 2.4 mg subcutaneously weekly. Polyphagia refers to excessive feelings of hunger. She will continue to focus on protein-rich, low simple carbohydrate foods. We reviewed the importance of hydration, regular exercise for stress reduction, and restorative sleep.  - Refill Semaglutide-Weight Management (WEGOVY) 2.4 MG/0.75ML SOAJ; Inject 2.4 mg into the skin once a week.  Dispense: 3 mL; Refill: 0  2. Hypothyroidism, followed by PCP, Rx Armour Medication: Armour Thyroid 90-120 mg alternating.   Plan: Patient was instructed not to take MVM or iron within 4 hours of taking thyroid medications. This issue is managed by her PCP. We will continue to monitor alongside Endocrinology/PCP as it relates to her weight loss journey.   Lab Results  Component Value Date   TSH 0.235 (L) 05/25/2019   3. SOB (shortness of breath) on  exertion Plan:  IC performed today.   4. Class 1 obesity with serious comorbidity and body mass index (BMI) of 31.0 to 31.9 in adult, unspecified obesity type  Course: Janice Brown is currently in the action stage of change. As such, her goal is to continue with weight loss efforts.   Nutrition goals: She has agreed to keeping a food journal and adhering to recommended goals of 1500 calories and 95 grams of protein.   Exercise goals: As is.  Behavioral modification strategies: increasing lean protein intake, decreasing simple carbohydrates, increasing vegetables and increasing water intake.  Janice Brown has agreed to follow-up with our clinic in 4 weeks. She was informed of the importance of frequent follow-up visits to maximize her success with intensive lifestyle modifications for her multiple health conditions.   Objective:   Blood pressure 111/69, pulse (!) 49, temperature 97.6 F (36.4 C), temperature source Oral, height 5\' 4"  (1.626 m), weight 186 lb (84.4 kg), SpO2 96 %. Body mass index is 31.93 kg/m.  General: Cooperative, alert, well developed, in no acute distress. HEENT: Conjunctivae and lids unremarkable. Cardiovascular: Regular rhythm.  Lungs: Normal work of breathing. Neurologic: No focal deficits.   Lab Results  Component Value Date   CREATININE 0.79 05/25/2019   BUN 10 05/25/2019   NA 144 05/25/2019   K 4.8 05/25/2019   CL 105 05/25/2019   CO2 23 05/25/2019   Lab Results  Component Value Date   ALT 23 05/25/2019   AST 20 05/25/2019   ALKPHOS 111 05/25/2019   BILITOT 0.5 05/25/2019   Lab Results  Component Value Date   HGBA1C 5.8 (  H) 05/25/2019   Lab Results  Component Value Date   INSULIN 17.6 05/25/2019   Lab Results  Component Value Date   TSH 0.235 (L) 05/25/2019   Lab Results  Component Value Date   CHOL 193 05/25/2019   HDL 53 05/25/2019   LDLCALC 112 (H) 05/25/2019   TRIG 163 (H) 05/25/2019   CHOLHDL 3.6 05/25/2019   Lab Results   Component Value Date   WBC 8.6 05/25/2019   HGB 14.4 05/25/2019   HCT 44.7 05/25/2019   MCV 84 05/25/2019   PLT 414 05/25/2019   Lab Results  Component Value Date   IRON 109 05/25/2019   TIBC 385 05/25/2019   FERRITIN 62 05/25/2019   Attestation Statements:   Reviewed by clinician on day of visit: allergies, medications, problem list, medical history, surgical history, family history, social history, and previous encounter notes.  I, Insurance claims handler, CMA, am acting as transcriptionist for Helane Rima, DO  I have reviewed the above documentation for accuracy and completeness, and I agree with the above. Helane Rima, DO

## 2020-04-01 NOTE — Progress Notes (Signed)
  Office: [phone]  /  Fax: [phone]    Date: April 09, 2020   Appointment Start Time: 8:01am Duration: 29 minutes Provider: Lawerance Cruel, Psy.D. Type of Session: Individual Therapy  Location of Patient: Home Location of Provider: Provider's Home (private office) Type of Contact: Telepsychological Visit via MyChart Video Visit  Session Content: Janice Brown is a 60 y.o. female presenting for a follow-up appointment to address the previously established treatment goal of increasing coping skills. Today's appointment was a telepsychological visit due to COVID-19. Janice Brown provided verbal consent for today's telepsychological appointment and she is aware she is responsible for securing confidentiality on her end of the session. Prior to proceeding with today's appointment, Janice Brown's physical location at the time of this appointment was obtained as well a phone number she could be reached at in the event of technical difficulties. Janice Brown and this provider participated in today's telepsychological service.   This provider conducted a brief check-in. Janice Brown discussed experiencing frustration with weight loss, as she feels she has hit a plateau. She acknowledged she is not journaling regularly. Associated thoughts and feelings were processed. It was reflected she is engaging in all or nothing thinking. Psychoeducation provided regarding the hunger and satisfaction scale to assist with being more mindful about hunger cues, which can also help with eating regularly/establishing a routine. Janice Brown was receptive to today's appointment as evidenced by openness to sharing, responsiveness to feedback, and willingness to utilize the hunger and satisfaction scale.  Mental Status Examination:  Appearance: well groomed and appropriate hygiene  Behavior: appropriate to circumstances Mood: anxious Affect: mood congruent Speech: normal in rate, volume, and tone Eye Contact:  appropriate Psychomotor Activity: unable to assess  Gait: unable to assess Thought Process: linear, logical, and goal directed  Thought Content/Perception: no hallucinations, delusions, bizarre thinking or behavior reported or observed and no evidence of suicidal and homicidal ideation, plan, and intent Orientation: time, person, place, and purpose of appointment Memory/Concentration: memory, attention, language, and fund of knowledge intact  Insight/Judgment: good   Interventions:  Conducted a brief chart review Provided empathic reflections and validation Employed supportive psychotherapy interventions to facilitate reduced distress and to improve coping skills with identified stressors Psychoeducation provided regarding the hunger and satisfaction scale Psychoeducation provided regarding all-or-nothing thinking  DSM-5 Diagnosis(es): 307.59 (F50.8) Other Specified Feeding or Eating Disorder, Emotional Eating Behaviors  Treatment Goal & Progress: During the initial appointment with this provider, the following treatment goal was established: increase coping skills. Janice Brown has demonstrated progress in her goal as evidenced by increased awareness of hunger patterns and increased awareness of triggers for emotional eating.   Plan: The next appointment will be scheduled in two weeks, which will be via MyChart Video Visit. The next session will focus on working towards the established treatment goal.

## 2020-04-01 NOTE — Telephone Encounter (Signed)
Last seen by Dr. Wallace. 

## 2020-04-02 ENCOUNTER — Telehealth (INDEPENDENT_AMBULATORY_CARE_PROVIDER_SITE_OTHER): Payer: Self-pay | Admitting: Psychology

## 2020-04-09 ENCOUNTER — Telehealth (INDEPENDENT_AMBULATORY_CARE_PROVIDER_SITE_OTHER): Payer: BC Managed Care – PPO | Admitting: Psychology

## 2020-04-09 DIAGNOSIS — F5089 Other specified eating disorder: Secondary | ICD-10-CM | POA: Diagnosis not present

## 2020-04-09 NOTE — Progress Notes (Signed)
  Office: [phone]  /  Fax: [phone]    Date: April 23, 2020   Appointment Start Time: 8:00am Duration: 30 minutes Provider: Lawerance Cruel, Psy.D. Type of Session: Individual Therapy  Location of Patient: Home Location of Provider: Provider's Home (private office) Type of Contact: Telepsychological Visit via MyChart Video Visit  Session Content: Janice Brown is a 60 y.o. female presenting for a follow-up appointment to address the previously established treatment goal of increasing coping skills. Today's appointment was a telepsychological visit due to COVID-19. Janice Brown provided verbal consent for today's telepsychological appointment and she is aware she is responsible for securing confidentiality on her end of the session. Prior to proceeding with today's appointment, Janice Brown's physical location at the time of this appointment was obtained as well a phone number she could be reached at in the event of technical difficulties. Janice Brown and this provider participated in today's telepsychological service. Notably, Janice Brown was unable to connect with video capabilities due to bandwidth issues.    This provider conducted a brief check-in. Janice Brown reported she feels things have been going "pretty well." She discussed losing weight and indicated an improvement in her eating habits for dinner. Notably, Janice Brown acknowledged that when she becomes "anxious," she has an urge to eat. As such, psychoeducation regarding mindfulness was provided. A handout was provided to Janice Brown with further information regarding mindfulness, including exercises. This provider also explained the benefit of mindfulness as it relates to emotional eating. Janice Brown was encouraged to engage in the provided exercises between now and the next appointment with this provider. Janice Brown agreed. During today's appointment, Janice Brown was led through a mindfulness exercise involving her senses. Janice Brown provided verbal  consent during today's appointment for this provider to send a handout about mindfulness via e-mail. Janice Brown was receptive to today's appointment as evidenced by openness to sharing, responsiveness to feedback, and willingness to engage in mindfulness exercises to assist with coping.  Mental Status Examination:  Appearance: unable to assess  Behavior: appropriate to circumstances Mood: euthymic Affect: unable to fully assess Speech: normal in rate, volume, and tone Eye Contact: unable to assess Psychomotor Activity: unable to assess  Gait: unable to assess Thought Process: linear, logical, and goal directed  Thought Content/Perception: no hallucinations, delusions, bizarre thinking or behavior reported or observed and no evidence of suicidal and homicidal ideation, plan, and intent Orientation: time, person, place, and purpose of appointment Memory/Concentration: memory, attention, language, and fund of knowledge intact  Insight/Judgment: good  Interventions:  Conducted a brief chart review Provided empathic reflections and validation Employed supportive psychotherapy interventions to facilitate reduced distress and to improve coping skills with identified stressors Psychoeducation provided regarding mindfulness Engaged patient in mindfulness exercise(s)  DSM-5 Diagnosis(es): 307.59 (F50.8) Other Specified Feeding or Eating Disorder, Emotional Eating Behaviors  Treatment Goal & Progress: During the initial appointment with this provider, the following treatment goal was established: increase coping skills. Janice Brown has demonstrated progress in her goal as evidenced by increased awareness of hunger patterns and increased awareness of triggers for emotional eating. Janice Brown also demonstrates willingness to engage in mindfulness exercises.  Plan: Based on recent progress, the next appointment will be scheduled in three weeks, which will be via MyChart Video Visit. The next session will  focus on working towards the established treatment goal.

## 2020-04-13 ENCOUNTER — Other Ambulatory Visit (HOSPITAL_COMMUNITY): Payer: Self-pay

## 2020-04-22 ENCOUNTER — Other Ambulatory Visit (HOSPITAL_COMMUNITY): Payer: Self-pay

## 2020-04-22 MED FILL — Semaglutide (Weight Mngmt) Soln Auto-Injector 2.4 MG/0.75ML: SUBCUTANEOUS | 28 days supply | Qty: 3 | Fill #0 | Status: AC

## 2020-04-23 ENCOUNTER — Telehealth (INDEPENDENT_AMBULATORY_CARE_PROVIDER_SITE_OTHER): Payer: BC Managed Care – PPO | Admitting: Psychology

## 2020-04-23 DIAGNOSIS — F5089 Other specified eating disorder: Secondary | ICD-10-CM

## 2020-04-30 NOTE — Progress Notes (Signed)
Office: [phone]  /  Fax: [phone]    Date: May 14, 2020   Appointment Start Time: 8:01am Duration: 31 minutes Provider: Lawerance Cruel, Psy.D. Type of Session: Individual Therapy  Location of Patient: Home Location of Provider: Provider's Home (private office) Type of Contact: Telepsychological Visit via MyChart Video Visit  Session Content: Janice Brown is a 60 y.o. female presenting for a follow-up appointment to address the previously established treatment goal of increasing coping skills. Today's appointment was a telepsychological visit due to COVID-19. Janice Brown provided verbal consent for today's telepsychological appointment and she is aware she is responsible for securing confidentiality on her end of the session. Prior to proceeding with today's appointment, Janice Brown's physical location at the time of this appointment was obtained as well a phone number she could be reached at in the event of technical difficulties. Janice Brown and this provider participated in today's telepsychological service.   This provider conducted a brief check-in. Janice Brown shared she continues to focus on physical activity, noting she is swimming, weight lifting, and using her Peloton bike. She acknowledged she is not "happy with the result" of her recent weight loss, adding she plans to focus on consistent tracking of her food this month. Associated thoughts and feelings were briefly processed. This provider recommended Janice Brown focus on protein intake as she discussed increased hunger at times. Notably, she discussed use of mindfulness exercises to determine when she was physically hungry, adding, "It was really helpful." She also described a reduction in emotional eating. Moreover, Janice Brown discussed challenges with recent eating habits when traveling and visiting with family. Thus, psychoeducation regarding making better choices and engaging in portion control during the holidays/celebrations/vacations  was provided. More specifically, this provider discussed the following strategies: coming to meals hungry, but not starving; avoid filling up on appetizers; managing portion sizes; not completely depriving yourself; making the plate colorful (e.g., vegetables); pacing yourself (e.g., waiting 10 minutes before going back for seconds); taking advantage of the nutritious foods; practicing mindfulness; staying hydrated; and avoid bringing home leftovers. Janice Brown was receptive to today's appointment as evidenced by openness to sharing, responsiveness to feedback, and willingness to implement discussed strategies .  Mental Status Examination:  Appearance: well groomed and appropriate hygiene  Behavior: appropriate to circumstances Mood: euthymic Affect: mood congruent Speech: normal in rate, volume, and tone Eye Contact: appropriate Psychomotor Activity: unable to assess  Gait: unable to assess Thought Process: linear, logical, and goal directed  Thought Content/Perception: no hallucinations, delusions, bizarre thinking or behavior reported or observed and no evidence or endorsement of suicidal and homicidal ideation, plan, and intent Orientation: time, person, place, and purpose of appointment Memory/Concentration: memory, attention, language, and fund of knowledge intact  Insight/Judgment: good  Interventions:  Conducted a brief chart review Provided empathic reflections and validation Employed supportive psychotherapy interventions to facilitate reduced distress and to improve coping skills with identified stressors Discussed strategies for holdiays/vacations/celebrations   DSM-5 Diagnosis(es): F50.89 Other Specified Feeding or Eating Disorder, Emotional Eating Behaviors  Treatment Goal & Progress: During the initial appointment with this provider, the following treatment goal was established: increase coping skills. Janice Brown has demonstrated progress in her goal as evidenced by increased  awareness of hunger patterns, increased awareness of triggers for emotional eating and reduction in emotional eating. Janice Brown also continues to demonstrate willingness to engage in learned skill(s).  Plan: The next appointment will be scheduled in three weeks, which will be via MyChart Video Visit. The next session will focus on working towards the established treatment goal  and possible termination planning.

## 2020-05-14 ENCOUNTER — Telehealth (INDEPENDENT_AMBULATORY_CARE_PROVIDER_SITE_OTHER): Payer: BC Managed Care – PPO | Admitting: Psychology

## 2020-05-14 DIAGNOSIS — F5089 Other specified eating disorder: Secondary | ICD-10-CM | POA: Diagnosis not present

## 2020-05-15 ENCOUNTER — Other Ambulatory Visit (HOSPITAL_COMMUNITY): Payer: Self-pay

## 2020-05-15 ENCOUNTER — Ambulatory Visit (INDEPENDENT_AMBULATORY_CARE_PROVIDER_SITE_OTHER): Payer: BC Managed Care – PPO | Admitting: Family Medicine

## 2020-05-15 ENCOUNTER — Encounter (INDEPENDENT_AMBULATORY_CARE_PROVIDER_SITE_OTHER): Payer: Self-pay | Admitting: Family Medicine

## 2020-05-15 ENCOUNTER — Other Ambulatory Visit: Payer: Self-pay

## 2020-05-15 VITALS — BP 107/69 | HR 58 | Temp 97.9°F | Ht 64.0 in | Wt 182.0 lb

## 2020-05-15 DIAGNOSIS — E038 Other specified hypothyroidism: Secondary | ICD-10-CM | POA: Diagnosis not present

## 2020-05-15 DIAGNOSIS — Z6838 Body mass index (BMI) 38.0-38.9, adult: Secondary | ICD-10-CM | POA: Diagnosis not present

## 2020-05-15 DIAGNOSIS — R632 Polyphagia: Secondary | ICD-10-CM | POA: Diagnosis not present

## 2020-05-15 MED ORDER — SEMAGLUTIDE-WEIGHT MANAGEMENT 2.4 MG/0.75ML ~~LOC~~ SOAJ
2.4000 mg | SUBCUTANEOUS | 2 refills | Status: DC
  Filled 2020-05-15: qty 3, 28d supply, fill #0
  Filled 2020-06-13 – 2020-06-18 (×2): qty 3, 28d supply, fill #1

## 2020-05-15 MED ORDER — PHENTERMINE HCL 37.5 MG PO TABS
37.5000 mg | ORAL_TABLET | Freq: Every day | ORAL | 0 refills | Status: DC
  Filled 2020-05-15: qty 90, 90d supply, fill #0

## 2020-05-20 ENCOUNTER — Other Ambulatory Visit (HOSPITAL_COMMUNITY): Payer: Self-pay

## 2020-05-21 NOTE — Progress Notes (Signed)
Chief Complaint:   OBESITY Hind is here to discuss her progress with her obesity treatment plan along with follow-up of her obesity related diagnoses.   Today's visit was #: 13 Starting weight: 222 lbs Starting date: 05/25/2019 Today's weight: 182 lbs Today's date: 05/15/2020 Total lbs lost to date: 40 lbs Body mass index is 31.24 kg/m.  Total weight loss percentage to date: -18.02%  Interim History:  Janice Brown says she is concentrating on food intake this month and is working to find consistency regarding protein at each meal.  She has been to more social gatherings, which has been making it harder to meet her calorie goals.  Current Meal Plan: keeping a food journal and adhering to recommended goals of 1500 calories and 95 grams of protein for 25% of the time.  Current Exercise Plan: Swimming/cardio/strength training for 60-115 minutes 3-5 times per week. Current Anti-Obesity Medications: phentermine 37.5 mg daily and Ozempic 2.4 mg subcutaneously weekly. Side effects: None.  Assessment/Plan:   1. Polyphagia Controlled. Current treatment: phentermine 37.5 mg daily and Ozempic 2.4 mg subcutaneously weekly. She will continue to focus on protein-rich, low simple carbohydrate foods. We reviewed the importance of hydration, regular exercise for stress reduction, and restorative sleep.  - Refill phentermine (ADIPEX-P) 37.5 MG tablet; Take 1 tablet (37.5 mg total) by mouth daily before breakfast.  Dispense: 90 tablet; Refill: 0 - Refill Semaglutide-Weight Management 2.4 MG/0.75ML SOAJ; Inject 2.4 mg into the skin once a week.  Dispense: 3 mL; Refill: 2  Having again reminded the patient of the "off label" use of Phentermine beyond three consecutive months, and again discussing the risks, benefits, contraindications, and limitations of it's use; given it's role in the successful treatment of obesity thus far and lack of adverse effect, patient has expressed desire and given informed  verbal consent to continue use.   I have consulted the [place] Controlled Substances Registry for this patient, and feel the risk/benefit ratio today is favorable for proceeding with this prescription for a controlled substance. The patient understands monitoring parameters and red flags.   2. Hypothyroidism, followed by PCP, Rx Armour Medication: Armour Thyroid 120 mg daily.   Plan: Patient was instructed not to take MVM or iron within 4 hours of taking thyroid medications. We will continue to monitor alongside Endocrinology/PCP as it relates to her weight loss journey.   Lab Results  Component Value Date   TSH 0.235 (L) 05/25/2019   3. Obesity, current BMI 31.3  Course: Janice Brown is currently in the action stage of change. As such, her goal is to continue with weight loss efforts.   Nutrition goals: She has agreed to keeping a food journal and adhering to recommended goals of 1500 calories and 95 grams of protein.   Exercise goals: For substantial health benefits, adults should do at least 150 minutes (2 hours and 30 minutes) a week of moderate-intensity, or 75 minutes (1 hour and 15 minutes) a week of vigorous-intensity aerobic physical activity, or an equivalent combination of moderate- and vigorous-intensity aerobic activity. Aerobic activity should be performed in episodes of at least 10 minutes, and preferably, it should be spread throughout the week.  Behavioral modification strategies: increasing lean protein intake, decreasing simple carbohydrates, increasing vegetables and increasing water intake.  Ailea has agreed to follow-up with our clinic in 4 weeks. She was informed of the importance of frequent follow-up visits to maximize her success with intensive lifestyle modifications for her multiple health conditions.   Objective:  Blood pressure 107/69, pulse (!) 58, temperature 97.9 F (36.6 C), temperature source Oral, height 5\' 4"  (1.626 m), weight 182 lb (82.6 kg), SpO2 98  %. Body mass index is 31.24 kg/m.  General: Cooperative, alert, well developed, in no acute distress. HEENT: Conjunctivae and lids unremarkable. Cardiovascular: Regular rhythm.  Lungs: Normal work of breathing. Neurologic: No focal deficits.   Lab Results  Component Value Date   CREATININE 0.79 05/25/2019   BUN 10 05/25/2019   NA 144 05/25/2019   K 4.8 05/25/2019   CL 105 05/25/2019   CO2 23 05/25/2019   Lab Results  Component Value Date   ALT 23 05/25/2019   AST 20 05/25/2019   ALKPHOS 111 05/25/2019   BILITOT 0.5 05/25/2019   Lab Results  Component Value Date   HGBA1C 5.8 (H) 05/25/2019   Lab Results  Component Value Date   INSULIN 17.6 05/25/2019   Lab Results  Component Value Date   TSH 0.235 (L) 05/25/2019   Lab Results  Component Value Date   CHOL 193 05/25/2019   HDL 53 05/25/2019   LDLCALC 112 (H) 05/25/2019   TRIG 163 (H) 05/25/2019   CHOLHDL 3.6 05/25/2019   Lab Results  Component Value Date   WBC 8.6 05/25/2019   HGB 14.4 05/25/2019   HCT 44.7 05/25/2019   MCV 84 05/25/2019   PLT 414 05/25/2019   Lab Results  Component Value Date   IRON 109 05/25/2019   TIBC 385 05/25/2019   FERRITIN 62 05/25/2019   Attestation Statements:   Reviewed by clinician on day of visit: allergies, medications, problem list, medical history, surgical history, family history, social history, and previous encounter notes.  I, 05/27/2019, CMA, am acting as transcriptionist for Insurance claims handler, DO  I have reviewed the above documentation for accuracy and completeness, and I agree with the above. Helane Rima, DO

## 2020-05-21 NOTE — Progress Notes (Signed)
  Office: [phone]  /  Fax: [phone]    Date: Jun 04, 2020   Appointment Start Time: 8:01am Duration: 29 minutes Provider: Lawerance Cruel, Psy.D. Type of Session: Individual Therapy  Location of Patient: Home Location of Provider: Provider's home (private office) Type of Contact: Telepsychological Visit via MyChart Video Visit  Session Content: Jmya is a 60 y.o. female presenting for a follow-up appointment to address the previously established treatment goal of increasing coping skills. Today's appointment was a telepsychological visit due to COVID-19. Jaiyanna provided verbal consent for today's telepsychological appointment and she is aware she is responsible for securing confidentiality on her end of the session. Prior to proceeding with today's appointment, Norlene's physical location at the time of this appointment was obtained as well a phone number she could be reached at in the event of technical difficulties. [name] and this provider participated in today's telepsychological service.   This provider conducted a brief check-in. Tekeshia shared she is "doing well" with journaling, noting it has helped her with weight loss and accountability when eating out. She continues to check in with herself about whether she is experiencing physical or emotional hunger. Positive reinforcement was provided. Vibha also described an increase in confidence and reduction in all or nothing thinking. She further reflected on other changes/successes to date with her journey at the clinic. Moreover, session focused further on mindfulness to assist with coping. This provider discussed the utilization of YouTube for mindfulness exercises (e.g., exercises by Rhae Hammock).   Furthermore, termination planning was discussed. Raeli was receptive to an additional follow-up/termination appointment in 3-4 weeks after that. Overall, Fantasia was receptive to today's appointment as evidenced by  openness to sharing, responsiveness to feedback, and willingness to continue engaging in mindfulness exercises.  Mental Status Examination:  Appearance: well groomed and appropriate hygiene  Behavior: appropriate to circumstances Mood: euthymic Affect: mood congruent Speech: normal in rate, volume, and tone Eye Contact: appropriate Psychomotor Activity: unable to assess  Gait: unable to assess Thought Process: linear, logical, and goal directed  Thought Content/Perception: no hallucinations, delusions, bizarre thinking or behavior reported or observed and no evidence or endorsement of suicidal and homicidal ideation, plan, and intent Orientation: time, person, place, and purpose of appointment Memory/Concentration: memory, attention, language, and fund of knowledge intact  Insight/Judgment: good  Interventions:  Conducted a brief chart review Provided empathic reflections and validation Reviewed content from the previous session Provided positive reinforcement Employed supportive psychotherapy interventions to facilitate reduced distress and to improve coping skills with identified stressors Psychoeducation provided regarding mindfulness Discussed termination planning  DSM-5 Diagnosis(es): F50.89 Other Specified Feeding or Eating Disorder, Emotional Eating Behaviors  Treatment Goal & Progress: During the initial appointment with this provider, the following treatment goal was established: increase coping skills. Caliya has demonstrated progress in her goal as evidenced by increased awareness of hunger patterns, increased awareness of triggers for emotional eating and reduction in emotional eating. Hara also continues to demonstrate willingness to engage in learned skill(s).  Plan: The next appointment will be scheduled in 3-4 weeks, which will be via MyChart Video Visit. The next session will focus on working towards the established treatment goal and termination.

## 2020-06-04 ENCOUNTER — Telehealth (INDEPENDENT_AMBULATORY_CARE_PROVIDER_SITE_OTHER): Payer: BC Managed Care – PPO | Admitting: Psychology

## 2020-06-04 DIAGNOSIS — F5089 Other specified eating disorder: Secondary | ICD-10-CM | POA: Diagnosis not present

## 2020-06-14 ENCOUNTER — Other Ambulatory Visit (HOSPITAL_COMMUNITY): Payer: Self-pay

## 2020-06-17 ENCOUNTER — Encounter (INDEPENDENT_AMBULATORY_CARE_PROVIDER_SITE_OTHER): Payer: Self-pay | Admitting: Family Medicine

## 2020-06-18 ENCOUNTER — Other Ambulatory Visit (HOSPITAL_COMMUNITY): Payer: Self-pay

## 2020-06-18 NOTE — Progress Notes (Signed)
  Office: [phone]  /  Fax: [phone]    Date: July 02, 2020   Appointment Start Time: 8:02am Duration: 27 minutes Provider: Lawerance Cruel, Psy.D. Type of Session: Individual Therapy  Location of Patient: Home Location of Provider: Provider's home (private office) Type of Contact: Telepsychological Visit via MyChart Video Visit  Session Content: Janice Brown is a 60 y.o. female presenting for a follow-up appointment to address the previously established treatment goal of increasing coping skills. Today's appointment was a telepsychological visit due to COVID-19. Janice Brown provided verbal consent for today's telepsychological appointment and she is aware she is responsible for securing confidentiality on her end of the session. Prior to proceeding with today's appointment, Janice Brown's physical location at the time of this appointment was obtained as well a phone number she could be reached at in the event of technical difficulties. Janice Brown and this provider participated in today's telepsychological service.   This provider conducted a brief check-in. Janice Brown stated she was on vacation, adding, "I got so much done." Regarding eating, she stated, "I'm doing really well." Progress to date was reviewed and positive reinforcement was provided. A plan was developed to help Janice Brown cope with emotional eating in the future using learned skills. She wrote down the following plan: focus on hydration; be prepared with snacks congruent to the meal plan; pause to ask questions when triggered to eat (e.g., Am I really hungry?, Is there something bothering me?, and Will I feel better if I eat?); and engage in discussed coping strategies after going through the aforementioned questions. Overall, Janice Brown was receptive to today's appointment as evidenced by openness to sharing, responsiveness to feedback, and willingness to continue engaging in learned skills.  Mental Status Examination:  Appearance: well  groomed and appropriate hygiene  Behavior: appropriate to circumstances Mood: euthymic Affect: mood congruent Speech: normal in rate, volume, and tone Eye Contact: appropriate Psychomotor Activity: unable to assess  Gait: unable to assess Thought Process: linear, logical, and goal directed  Thought Content/Perception: no hallucinations, delusions, bizarre thinking or behavior reported or observed and no evidence or endorsement of suicidal and homicidal ideation, plan, and intent Orientation: time, person, place, and purpose of appointment Memory/Concentration: memory, attention, language, and fund of knowledge intact  Insight/Judgment: good  Interventions:  Conducted a brief chart review Provided empathic reflections and validation Provided positive reinforcement Employed supportive psychotherapy interventions to facilitate reduced distress and to improve coping skills with identified stressors Reviewed learned skills  DSM-5 Diagnosis(es): F50.89 Other Specified Feeding or Eating Disorder, Emotional Eating Behaviors  Treatment Goal & Progress: During the initial appointment with this provider, the following treatment goal was established: increase coping skills. Janice Brown demonstrated progress in her goal as evidenced by increased awareness of hunger patterns, increased awareness of triggers for emotional eating behaviors, and reduction in emotional eating behaviors . [name] also continues to demonstrate willingness to engage in learned skill(s).  Plan: As previously planned, today was Janice Brown's last appointment with this provider. She acknowledged understanding that she may request a follow-up appointment with this provider in the future as long as she is still established with the clinic. No further follow-up planned by this provider.

## 2020-06-20 ENCOUNTER — Other Ambulatory Visit (HOSPITAL_COMMUNITY): Payer: Self-pay

## 2020-06-20 ENCOUNTER — Ambulatory Visit (INDEPENDENT_AMBULATORY_CARE_PROVIDER_SITE_OTHER): Payer: BC Managed Care – PPO | Admitting: Family Medicine

## 2020-06-20 ENCOUNTER — Encounter (INDEPENDENT_AMBULATORY_CARE_PROVIDER_SITE_OTHER): Payer: Self-pay | Admitting: Family Medicine

## 2020-06-20 ENCOUNTER — Other Ambulatory Visit: Payer: Self-pay

## 2020-06-20 VITALS — BP 99/64 | HR 50 | Temp 97.7°F | Ht 64.0 in | Wt 178.0 lb

## 2020-06-20 DIAGNOSIS — R632 Polyphagia: Secondary | ICD-10-CM

## 2020-06-20 DIAGNOSIS — Z6838 Body mass index (BMI) 38.0-38.9, adult: Secondary | ICD-10-CM

## 2020-06-20 MED ORDER — SEMAGLUTIDE-WEIGHT MANAGEMENT 2.4 MG/0.75ML ~~LOC~~ SOAJ
2.4000 mg | SUBCUTANEOUS | 12 refills | Status: DC
  Filled 2020-06-20 – 2020-07-12 (×2): qty 3, 28d supply, fill #0
  Filled 2020-08-12: qty 3, 28d supply, fill #1
  Filled 2020-09-09: qty 3, 28d supply, fill #2
  Filled 2020-10-09: qty 3, 28d supply, fill #3
  Filled 2020-11-04: qty 3, 28d supply, fill #4

## 2020-06-25 ENCOUNTER — Other Ambulatory Visit (HOSPITAL_COMMUNITY): Payer: Self-pay

## 2020-06-26 NOTE — Progress Notes (Signed)
Chief Complaint:   OBESITY Janice Brown is here to discuss her progress with her obesity treatment plan along with follow-up of her obesity related diagnoses.   Today's visit was #: 14 Starting weight: 222 lbs Starting date: 05/25/2019 Today's weight: 178 lbs Today's date: 06/20/2020 Weight change since last visit: -4 lbs Total lbs lost to date: 44 lbs Body mass index is 30.55 kg/m.  Total weight loss percentage to date: -19.82%  Interim History: Janice Brown is feeling good about her food intake and exercise regimen. Current Meal Plan: keeping a food journal and adhering to recommended goals of 1500 calories and 95 grams of protein for 75% of the time.  Current Exercise Plan: Swimming/cardio/strenght training for 45-75 minutes 4 times per week. Current Anti-Obesity Medications: phentermine 37.5 mg daily and Wegovy 2.4 mg subcutaneously weekly. Side effects: None.  Assessment/Plan:   1. Polyphagia Controlled. Current treatment: phentermine 37.5 mg daily and Wegovy 2.4 mg subcutaneously weekly. Polyphagia refers to excessive feelings of hunger.   Plan: She will continue to focus on protein-rich, low simple carbohydrate foods. We reviewed the importance of hydration, regular exercise for stress reduction, and restorative sleep. She will continue phentermine and Wegovy. We will refill Wegovy, as per below.  - Refill Semaglutide-Weight Management 2.4 MG/0.75ML SOAJ; Inject 2.4 mg into the skin once a week.  Dispense: 3 mL; Refill: 12  2. Obesity, current BMI 30.7 Course: Janice Brown is currently in the action stage of change. As such, her goal is to continue with weight loss efforts.   Nutrition goals: She has agreed to keeping a food journal and adhering to recommended goals of 1500 calories and 95 grams of protein.   Exercise goals: As is.  Behavioral modification strategies: increasing lean protein intake, decreasing simple carbohydrates, increasing vegetables, and increasing  water intake.  Janice Brown has agreed to follow-up with our clinic in 4 weeks. She was informed of the importance of frequent follow-up visits to maximize her success with intensive lifestyle modifications for her multiple health conditions.   Objective:   Blood pressure 99/64, pulse (!) 50, temperature 97.7 F (36.5 C), temperature source Oral, height 5\' 4"  (1.626 m), weight 178 lb (80.7 kg), SpO2 97 %. Body mass index is 30.55 kg/m.  General: Cooperative, alert, well developed, in no acute distress. HEENT: Conjunctivae and lids unremarkable. Cardiovascular: Regular rhythm.  Lungs: Normal work of breathing. Neurologic: No focal deficits.   Lab Results  Component Value Date   CREATININE 0.79 05/25/2019   BUN 10 05/25/2019   NA 144 05/25/2019   K 4.8 05/25/2019   CL 105 05/25/2019   CO2 23 05/25/2019   Lab Results  Component Value Date   ALT 23 05/25/2019   AST 20 05/25/2019   ALKPHOS 111 05/25/2019   BILITOT 0.5 05/25/2019   Lab Results  Component Value Date   HGBA1C 5.8 (H) 05/25/2019   Lab Results  Component Value Date   INSULIN 17.6 05/25/2019   Lab Results  Component Value Date   TSH 0.235 (L) 05/25/2019   Lab Results  Component Value Date   CHOL 193 05/25/2019   HDL 53 05/25/2019   LDLCALC 112 (H) 05/25/2019   TRIG 163 (H) 05/25/2019   CHOLHDL 3.6 05/25/2019   Lab Results  Component Value Date   WBC 8.6 05/25/2019   HGB 14.4 05/25/2019   HCT 44.7 05/25/2019   MCV 84 05/25/2019   PLT 414 05/25/2019   Lab Results  Component Value Date   IRON 109 05/25/2019  TIBC 385 05/25/2019   FERRITIN 62 05/25/2019   Attestation Statements:   Reviewed by clinician on day of visit: allergies, medications, problem list, medical history, surgical history, family history, social history, and previous encounter notes.  Carlos Levering Friedenbach, CMA, am acting as Energy manager for [employer], DO.  I have reviewed the above documentation for accuracy and  completeness, and I agree with the above. Helane Rima, DO

## 2020-07-02 ENCOUNTER — Telehealth (INDEPENDENT_AMBULATORY_CARE_PROVIDER_SITE_OTHER): Payer: BC Managed Care – PPO | Admitting: Psychology

## 2020-07-02 DIAGNOSIS — F5089 Other specified eating disorder: Secondary | ICD-10-CM

## 2020-07-12 ENCOUNTER — Other Ambulatory Visit (HOSPITAL_COMMUNITY): Payer: Self-pay

## 2020-07-16 ENCOUNTER — Other Ambulatory Visit (HOSPITAL_COMMUNITY): Payer: Self-pay

## 2020-07-24 ENCOUNTER — Ambulatory Visit (INDEPENDENT_AMBULATORY_CARE_PROVIDER_SITE_OTHER): Payer: BC Managed Care – PPO | Admitting: Family Medicine

## 2020-07-24 ENCOUNTER — Encounter (INDEPENDENT_AMBULATORY_CARE_PROVIDER_SITE_OTHER): Payer: Self-pay | Admitting: Family Medicine

## 2020-07-24 ENCOUNTER — Other Ambulatory Visit: Payer: Self-pay

## 2020-07-24 ENCOUNTER — Other Ambulatory Visit (HOSPITAL_COMMUNITY): Payer: Self-pay

## 2020-07-24 VITALS — BP 103/67 | HR 61 | Temp 97.9°F | Ht 64.0 in | Wt 179.0 lb

## 2020-07-24 DIAGNOSIS — R632 Polyphagia: Secondary | ICD-10-CM

## 2020-07-24 DIAGNOSIS — F5089 Other specified eating disorder: Secondary | ICD-10-CM | POA: Diagnosis not present

## 2020-07-24 DIAGNOSIS — R7301 Impaired fasting glucose: Secondary | ICD-10-CM | POA: Diagnosis not present

## 2020-07-24 DIAGNOSIS — Z6838 Body mass index (BMI) 38.0-38.9, adult: Secondary | ICD-10-CM

## 2020-07-24 DIAGNOSIS — E038 Other specified hypothyroidism: Secondary | ICD-10-CM | POA: Diagnosis not present

## 2020-07-24 MED ORDER — PHENTERMINE HCL 37.5 MG PO TABS
37.5000 mg | ORAL_TABLET | Freq: Every day | ORAL | 0 refills | Status: DC
  Filled 2020-07-24 – 2020-09-09 (×3): qty 90, 90d supply, fill #0

## 2020-07-24 MED ORDER — MOUNJARO 2.5 MG/0.5ML ~~LOC~~ SOAJ
2.5000 mg | SUBCUTANEOUS | 0 refills | Status: DC
  Filled 2020-07-24: qty 2, 28d supply, fill #0

## 2020-07-25 ENCOUNTER — Other Ambulatory Visit (HOSPITAL_COMMUNITY): Payer: Self-pay

## 2020-07-25 ENCOUNTER — Encounter (INDEPENDENT_AMBULATORY_CARE_PROVIDER_SITE_OTHER): Payer: Self-pay | Admitting: Family Medicine

## 2020-07-25 NOTE — Telephone Encounter (Signed)
Prior authorization has been started 

## 2020-07-29 ENCOUNTER — Encounter (INDEPENDENT_AMBULATORY_CARE_PROVIDER_SITE_OTHER): Payer: Self-pay

## 2020-08-06 ENCOUNTER — Other Ambulatory Visit (HOSPITAL_COMMUNITY): Payer: Self-pay

## 2020-08-06 NOTE — Progress Notes (Signed)
Chief Complaint:   OBESITY Janice Brown is here to discuss her progress with her obesity treatment plan along with follow-up of her obesity related diagnoses.   Today's visit was #: 15 Starting weight: 222 lbs Starting date: 05/25/2019 Today's weight: 179 lbs Today's date: 07/24/2020 Weight change since last visit: +1 lb Total lbs lost to date: 43 lbs Body mass index is 30.73 kg/m.  Total weight loss percentage to date: -19.37%  Interim History:  Janice Brown says she has been focused on exercise.  She is learning to kick when swimming.  She says she is enjoying family time.  Current Meal Plan: keeping a food journal and adhering to recommended goals of 1500 calories and 95 grams of protein for 80% of the time.  Current Exercise Plan: Swimming/strength training for 45-65 minutes 5 times per week. Current Anti-Obesity Medications: phentermine 37.5 mg daily and Wegovy 2.4 mg subcutaneously weekly. Side effects: None.  Assessment/Plan:   Meds ordered this encounter  Medications   phentermine (ADIPEX-P) 37.5 MG tablet    Sig: Take 1 tablet (37.5 mg total) by mouth daily before breakfast.    Dispense:  90 tablet    Refill:  0   tirzepatide (MOUNJARO) 2.5 MG/0.5ML Pen    Sig: Inject 2.5 mg into the skin once a week.    Dispense:  2 mL    Refill:  0    1. Polyphagia Controlled. Current treatment:  phentermine 37.5 mg daily and Wegovy 2.4 mg subcutaneously weekly. She will continue to focus on protein-rich, low simple carbohydrate foods. We reviewed the importance of hydration, regular exercise for stress reduction, and restorative sleep.  I have consulted the [place] Controlled Substances Registry for this patient, and feel the risk/benefit ratio today is favorable for proceeding with this prescription for a controlled substance. The patient understands monitoring parameters and red flags.   - Refill phentermine (ADIPEX-P) 37.5 MG tablet; Take 1 tablet (37.5 mg total) by mouth daily  before breakfast.  Dispense: 90 tablet; Refill: 0  2. Hypothyroidism, followed by PCP, Rx Armour Medication: Armour Thyroid 120 mg daily.   Plan: Patient was instructed not to take MVM or iron within 4 hours of taking thyroid medications.  We will continue to monitor alongside Endocrinology/PCP as it relates to her weight loss journey.   Lab Results  Component Value Date   TSH 0.235 (L) 05/25/2019   3. Impaired fasting glucose Will start her on Mounjaro 2.5 mg subcutaneously weekly, as per below.  - Start tirzepatide [facility]) 2.5 MG/0.5ML Pen; Inject 2.5 mg into the skin once a week.  Dispense: 2 mL; Refill: 0  4. Emotional eating behaviors Controlled. Medication: None.  Plan:  Discussed cues and consequences, how thoughts affect eating, model of thoughts, feelings, and behaviors, and strategies for change by focusing on the cue. Discussed cognitive distortions, coping thoughts, and how to change your thoughts.  5. Obesity, current BMI 30.8  Course: Janice Brown is currently in the action stage of change. As such, her goal is to continue with weight loss efforts.   Nutrition goals: She has agreed to keeping a food journal and adhering to recommended goals of 1500 calories and 95 grams of protein.   Exercise goals:  As is.  Behavioral modification strategies: increasing lean protein intake, decreasing simple carbohydrates, increasing vegetables, and increasing water intake.  Janice Brown has agreed to follow-up with our clinic in 4 weeks. She was informed of the importance of frequent follow-up visits to maximize her success with intensive  lifestyle modifications for her multiple health conditions.   Objective:   Blood pressure 103/67, pulse 61, temperature 97.9 F (36.6 C), temperature source Oral, height 5\' 4"  (1.626 m), weight 179 lb (81.2 kg), SpO2 97 %. Body mass index is 30.73 kg/m.  General: Cooperative, alert, well developed, in no acute distress. HEENT: Conjunctivae and  lids unremarkable. Cardiovascular: Regular rhythm.  Lungs: Normal work of breathing. Neurologic: No focal deficits.   Lab Results  Component Value Date   CREATININE 0.79 05/25/2019   BUN 10 05/25/2019   NA 144 05/25/2019   K 4.8 05/25/2019   CL 105 05/25/2019   CO2 23 05/25/2019   Lab Results  Component Value Date   ALT 23 05/25/2019   AST 20 05/25/2019   ALKPHOS 111 05/25/2019   BILITOT 0.5 05/25/2019   Lab Results  Component Value Date   HGBA1C 5.8 (H) 05/25/2019   Lab Results  Component Value Date   INSULIN 17.6 05/25/2019   Lab Results  Component Value Date   TSH 0.235 (L) 05/25/2019   Lab Results  Component Value Date   CHOL 193 05/25/2019   HDL 53 05/25/2019   LDLCALC 112 (H) 05/25/2019   TRIG 163 (H) 05/25/2019   CHOLHDL 3.6 05/25/2019   Lab Results  Component Value Date   VD25OH 36.7 05/25/2019   Lab Results  Component Value Date   WBC 8.6 05/25/2019   HGB 14.4 05/25/2019   HCT 44.7 05/25/2019   MCV 84 05/25/2019   PLT 414 05/25/2019   Lab Results  Component Value Date   IRON 109 05/25/2019   TIBC 385 05/25/2019   FERRITIN 62 05/25/2019   Attestation Statements:   Reviewed by clinician on day of visit: allergies, medications, problem list, medical history, surgical history, family history, social history, and previous encounter notes.  I, 05/27/2019, CMA, am acting as transcriptionist for Insurance claims handler, DO  I have reviewed the above documentation for accuracy and completeness, and I agree with the above. Helane Rima, DO

## 2020-08-12 ENCOUNTER — Other Ambulatory Visit (HOSPITAL_COMMUNITY): Payer: Self-pay

## 2020-08-20 ENCOUNTER — Telehealth (INDEPENDENT_AMBULATORY_CARE_PROVIDER_SITE_OTHER): Payer: BC Managed Care – PPO | Admitting: Psychology

## 2020-08-21 ENCOUNTER — Ambulatory Visit (INDEPENDENT_AMBULATORY_CARE_PROVIDER_SITE_OTHER): Payer: BC Managed Care – PPO | Admitting: Family Medicine

## 2020-09-05 ENCOUNTER — Ambulatory Visit (INDEPENDENT_AMBULATORY_CARE_PROVIDER_SITE_OTHER): Payer: BC Managed Care – PPO | Admitting: Family Medicine

## 2020-09-09 ENCOUNTER — Other Ambulatory Visit (HOSPITAL_COMMUNITY): Payer: Self-pay

## 2020-09-15 IMAGING — MG DIGITAL SCREENING BILAT W/ TOMO W/ CAD
6 of 12 series · 6 of 36 positions shown · non-contrast
Comparison: Previous exam(s).

CLINICAL DATA: Screening.

EXAM:
DIGITAL SCREENING BILATERAL MAMMOGRAM WITH TOMO AND CAD

[L MLO synth-2D (1 of 2)]
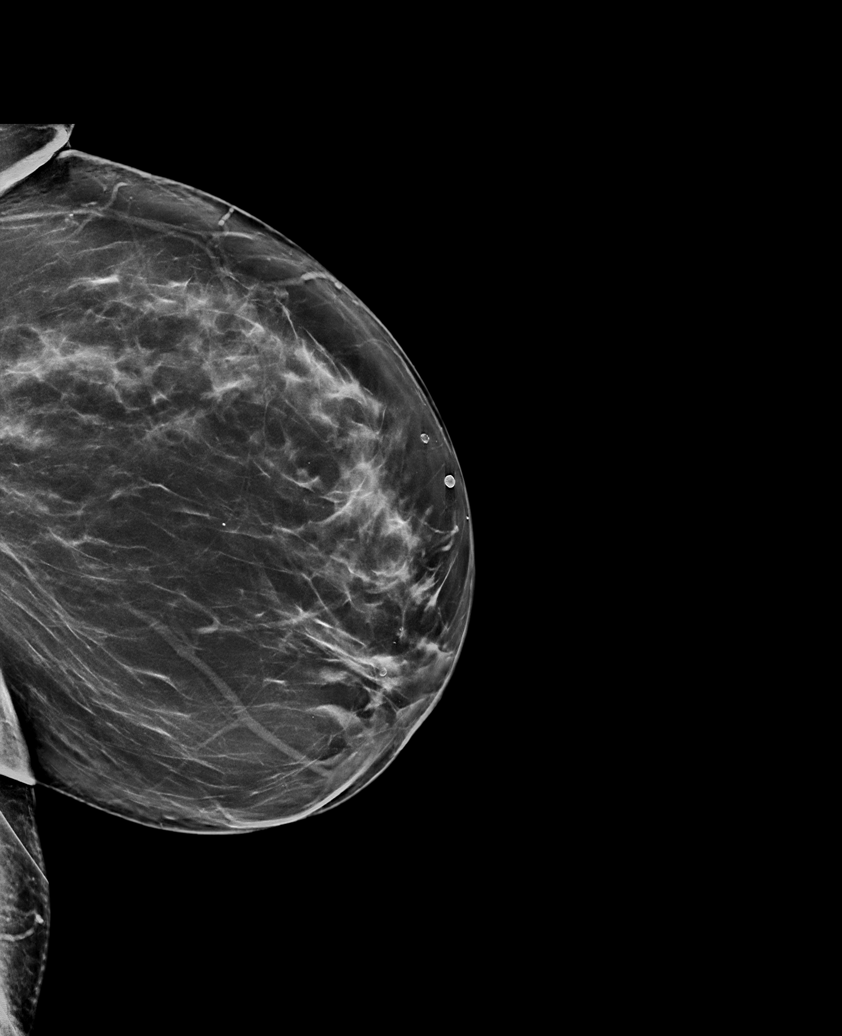

[R MLO synth-2D (1 of 2)]
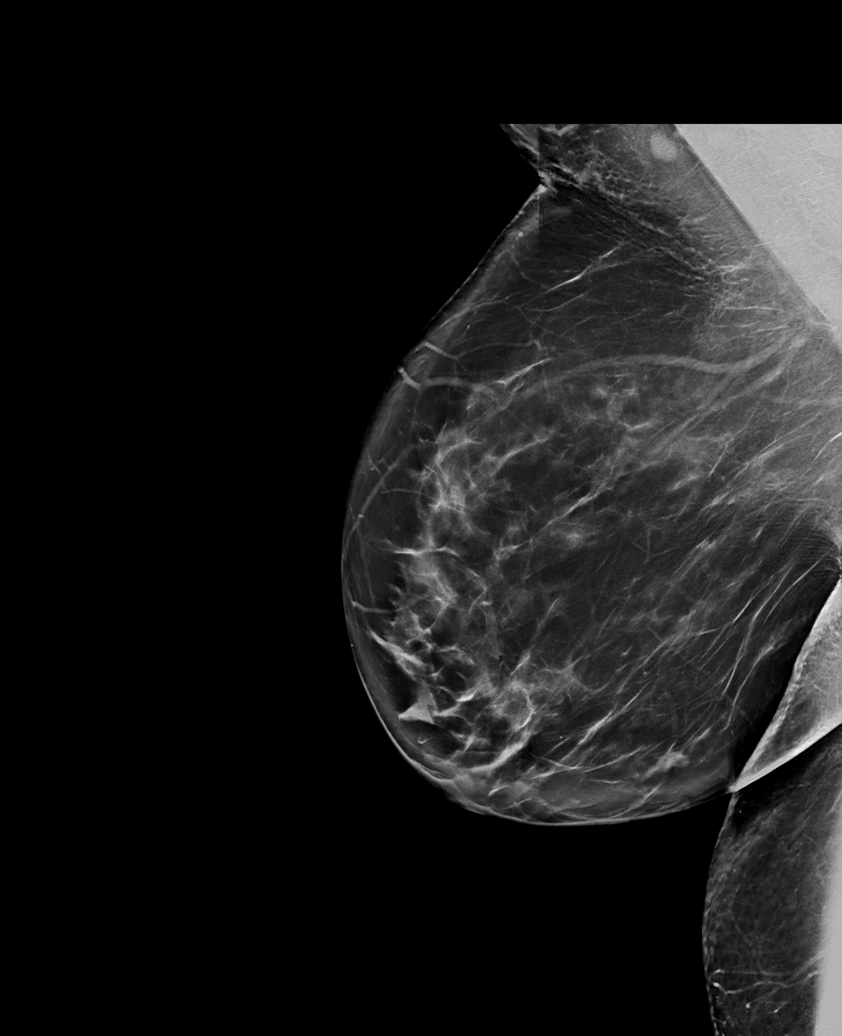

[R MLO synth-2D (2 of 2)]
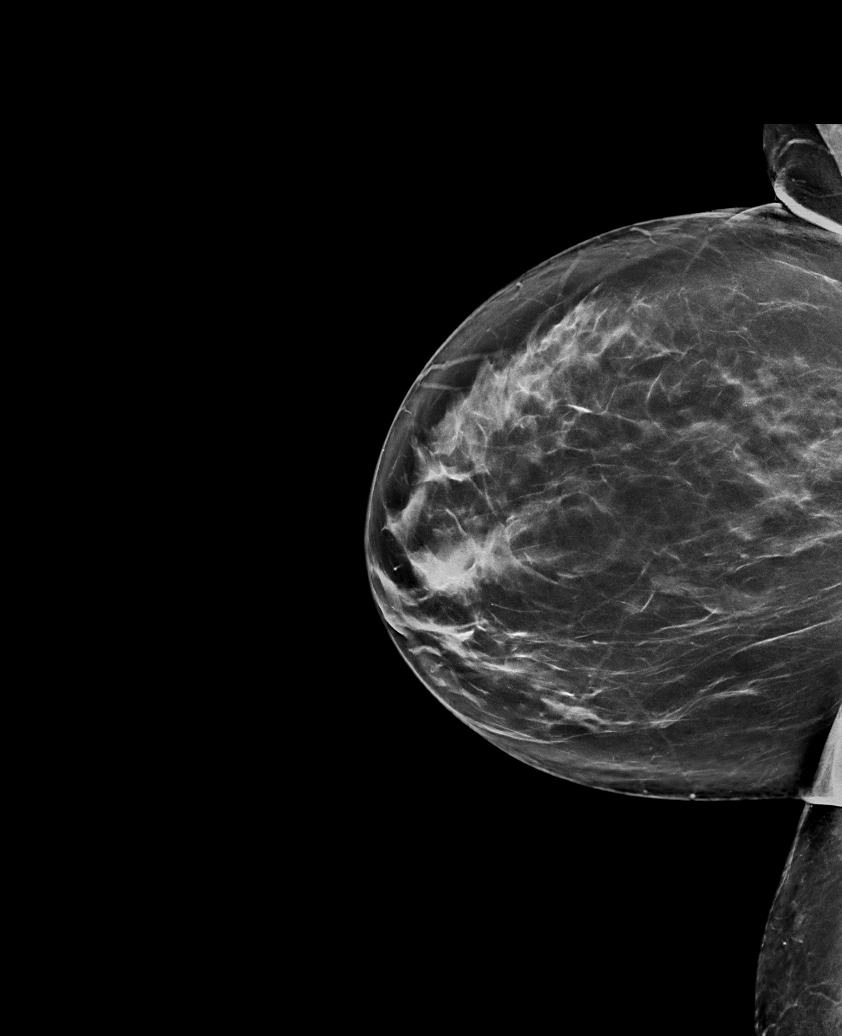

[R CC synth-2D]
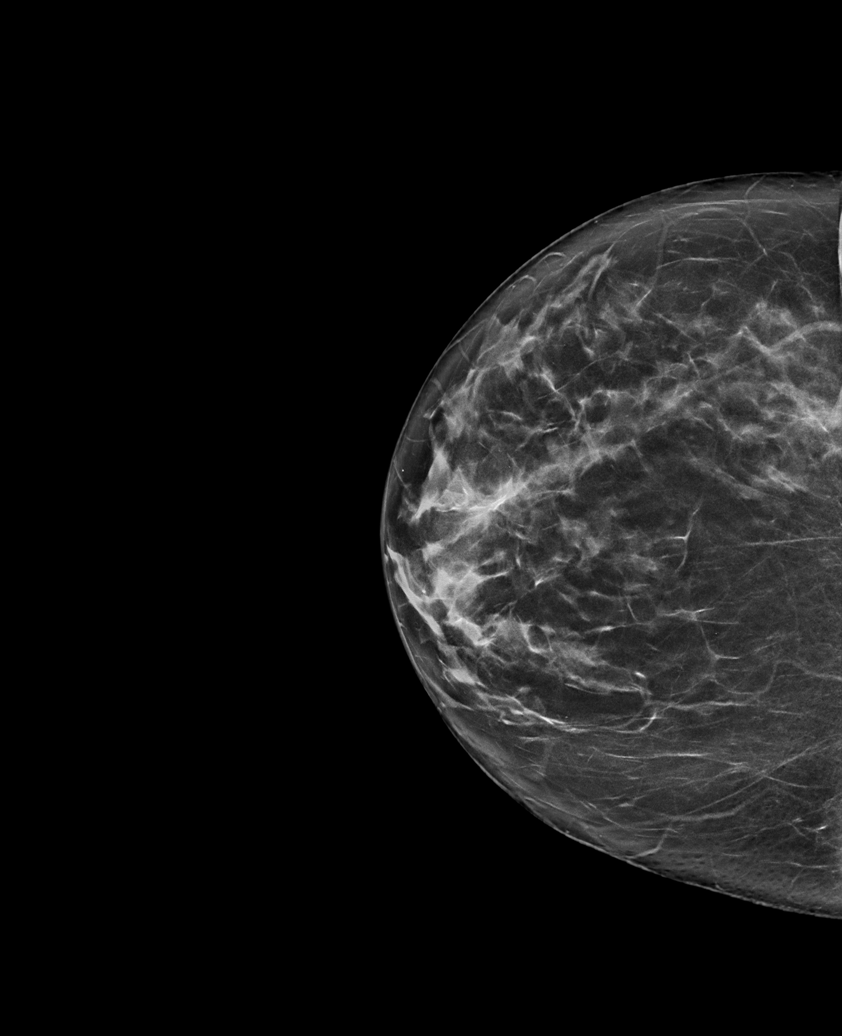

[L MLO synth-2D (2 of 2)]
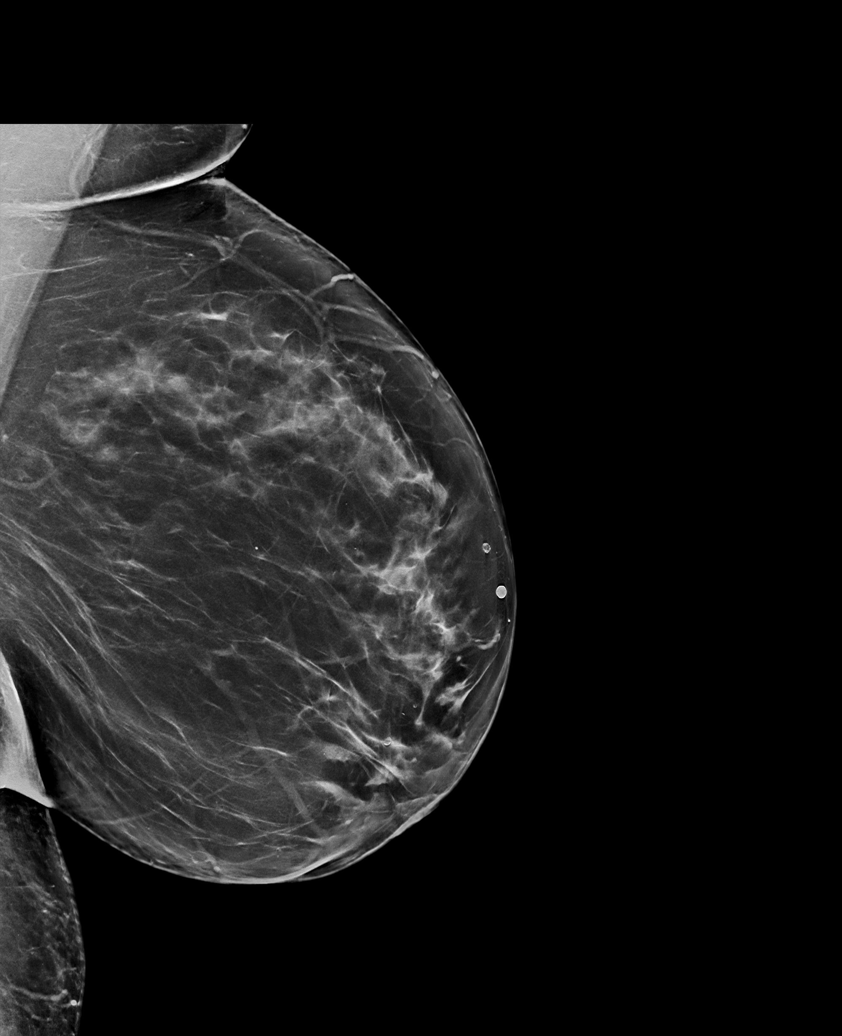

[L CC synth-2D]
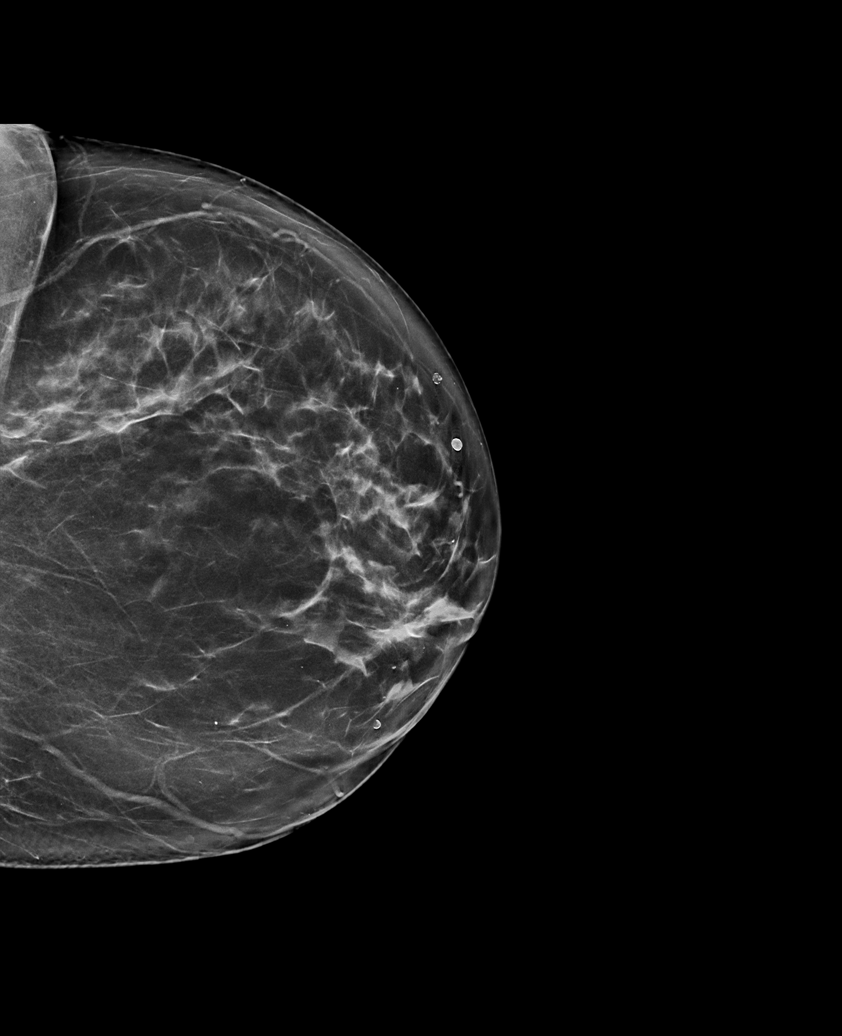

[6 of 36 positions shown; findings below may reference images not displayed]

ACR Breast Density Category b: There are scattered areas of
fibroglandular density.
FINDINGS: There are no findings suspicious for malignancy. Images were
processed with CAD.
IMPRESSION: No mammographic evidence of malignancy. A result letter of this
screening mammogram will be mailed directly to the patient.

RECOMMENDATION:
Screening mammogram in one year. (Code:[id number])

BI-RADS CATEGORY  1: Negative.

## 2020-10-09 ENCOUNTER — Ambulatory Visit (INDEPENDENT_AMBULATORY_CARE_PROVIDER_SITE_OTHER): Payer: BC Managed Care – PPO | Admitting: Family Medicine

## 2020-10-09 ENCOUNTER — Other Ambulatory Visit (HOSPITAL_COMMUNITY): Payer: Self-pay

## 2020-10-09 ENCOUNTER — Other Ambulatory Visit: Payer: Self-pay

## 2020-10-09 ENCOUNTER — Encounter (INDEPENDENT_AMBULATORY_CARE_PROVIDER_SITE_OTHER): Payer: Self-pay | Admitting: Family Medicine

## 2020-10-09 VITALS — BP 105/68 | HR 53 | Temp 97.9°F | Ht 64.0 in | Wt 177.0 lb

## 2020-10-09 DIAGNOSIS — F5089 Other specified eating disorder: Secondary | ICD-10-CM

## 2020-10-09 DIAGNOSIS — R7301 Impaired fasting glucose: Secondary | ICD-10-CM | POA: Diagnosis not present

## 2020-10-09 DIAGNOSIS — Z6838 Body mass index (BMI) 38.0-38.9, adult: Secondary | ICD-10-CM

## 2020-10-09 DIAGNOSIS — E038 Other specified hypothyroidism: Secondary | ICD-10-CM | POA: Diagnosis not present

## 2020-10-09 DIAGNOSIS — E66812 Obesity, class 2: Secondary | ICD-10-CM

## 2020-10-10 NOTE — Progress Notes (Signed)
Chief Complaint:   OBESITY Janice Brown is here to discuss her progress with her obesity treatment plan along with follow-up of her obesity related diagnoses.   Today's visit was #: 16 Starting weight: 222 lbs Starting date: 05/25/2019 Today's weight: 177 lbs Today's date: 10/09/2020 Weight change since last visit: 2 lbs Total lbs lost to date: 45 lbs Body mass index is 30.38 kg/m.  Total weight loss percentage to date: -20.27%  Current Meal Plan: keeping a food journal and adhering to recommended goals of 1500 calories and 95 grams of protein for 20% of the time.  Current Exercise Plan: Swimming/cardio/strength training for 45 minutes 5-6 times per week. Current Anti-Obesity Medications: phentermine 37.5 mg daily and Mounjaro 2.5 mg subcutaneously weekly. Side effects: None.  Interim History:  Janice Brown is doing well.  She says she is working on foods that fit her calorie/protein goals.  She is down 45 pounds today.  Assessment/Plan:   1. Hypothyroidism, followed by PCP, Rx Armour Medication: Armour Thyroid.   Plan: Patient was instructed not to take MVM or iron within 4 hours of taking thyroid medications.  We will continue to monitor alongside PCP as it relates to her weight loss journey.   2. Impaired fasting glucose, with polyphagia Controlled. Current treatment: phentermine 37.5 mg daily and Mounjaro 2.5 mg subcutaneously weekly. She will continue to focus on protein-rich, low simple carbohydrate foods. We reviewed the importance of hydration, regular exercise for stress reduction, and restorative sleep.  3. Emotional eating behaviors Controlled. Medication: None.  Plan:  Discussed cues and consequences, how thoughts affect eating, model of thoughts, feelings, and behaviors, and strategies for change by focusing on the cue. Discussed cognitive distortions, coping thoughts, and how to change your thoughts.  4. Obesity, current BMI 30.4  Course: Janice Brown is currently in  the action stage of change. As such, her goal is to continue with weight loss efforts.   Nutrition goals: She has agreed to keeping a food journal and adhering to recommended goals of 1500 calories and 95 grams of protein.   Exercise goals:  As is.  Behavioral modification strategies: increasing lean protein intake, decreasing simple carbohydrates, increasing vegetables, increasing water intake, and emotional eating strategies.  Alania has agreed to follow-up with our clinic in 4 weeks. She was informed of the importance of frequent follow-up visits to maximize her success with intensive lifestyle modifications for her multiple health conditions.   Objective:   Blood pressure 105/68, pulse (!) 53, temperature 97.9 F (36.6 C), temperature source Oral, height 5\' 4"  (1.626 m), weight 177 lb (80.3 kg), SpO2 99 %. Body mass index is 30.38 kg/m.  General: Cooperative, alert, well developed, in no acute distress. HEENT: Conjunctivae and lids unremarkable. Cardiovascular: Regular rhythm.  Lungs: Normal work of breathing. Neurologic: No focal deficits.   Lab Results  Component Value Date   CREATININE 0.79 05/25/2019   BUN 10 05/25/2019   NA 144 05/25/2019   K 4.8 05/25/2019   CL 105 05/25/2019   CO2 23 05/25/2019   Lab Results  Component Value Date   ALT 23 05/25/2019   AST 20 05/25/2019   ALKPHOS 111 05/25/2019   BILITOT 0.5 05/25/2019   Lab Results  Component Value Date   HGBA1C 5.8 (H) 05/25/2019   Lab Results  Component Value Date   INSULIN 17.6 05/25/2019   Lab Results  Component Value Date   TSH 0.235 (L) 05/25/2019   Lab Results  Component Value Date   CHOL  193 05/25/2019   HDL 53 05/25/2019   LDLCALC 112 (H) 05/25/2019   TRIG 163 (H) 05/25/2019   CHOLHDL 3.6 05/25/2019   Lab Results  Component Value Date   VD25OH 36.7 05/25/2019   Lab Results  Component Value Date   WBC 8.6 05/25/2019   HGB 14.4 05/25/2019   HCT 44.7 05/25/2019   MCV 84  05/25/2019   PLT 414 05/25/2019   Lab Results  Component Value Date   IRON 109 05/25/2019   TIBC 385 05/25/2019   FERRITIN 62 05/25/2019   Attestation Statements:   Reviewed by clinician on day of visit: allergies, medications, problem list, medical history, surgical history, family history, social history, and previous encounter notes.  I, Insurance claims handler, CMA, am acting as transcriptionist for Helane Rima, DO  I have reviewed the above documentation for accuracy and completeness, and I agree with the above. Helane Rima, DO

## 2020-11-04 ENCOUNTER — Other Ambulatory Visit (HOSPITAL_COMMUNITY): Payer: Self-pay

## 2020-11-12 ENCOUNTER — Encounter (INDEPENDENT_AMBULATORY_CARE_PROVIDER_SITE_OTHER): Payer: Self-pay | Admitting: Family Medicine

## 2020-11-12 ENCOUNTER — Other Ambulatory Visit: Payer: Self-pay

## 2020-11-12 ENCOUNTER — Other Ambulatory Visit (HOSPITAL_COMMUNITY): Payer: Self-pay

## 2020-11-12 ENCOUNTER — Ambulatory Visit (INDEPENDENT_AMBULATORY_CARE_PROVIDER_SITE_OTHER): Payer: BC Managed Care – PPO | Admitting: Family Medicine

## 2020-11-12 VITALS — BP 98/63 | HR 46 | Temp 97.7°F | Ht 64.0 in | Wt 176.0 lb

## 2020-11-12 DIAGNOSIS — R7303 Prediabetes: Secondary | ICD-10-CM

## 2020-11-12 DIAGNOSIS — Z6838 Body mass index (BMI) 38.0-38.9, adult: Secondary | ICD-10-CM | POA: Diagnosis not present

## 2020-11-12 MED ORDER — TIRZEPATIDE 10 MG/0.5ML ~~LOC~~ SOAJ
10.0000 mg | SUBCUTANEOUS | 0 refills | Status: DC
  Filled 2020-11-12 – 2020-11-20 (×4): qty 2, 28d supply, fill #0

## 2020-11-13 ENCOUNTER — Encounter (INDEPENDENT_AMBULATORY_CARE_PROVIDER_SITE_OTHER): Payer: Self-pay

## 2020-11-13 NOTE — Progress Notes (Signed)
Chief Complaint:   OBESITY Janice Brown is here to discuss her progress with her obesity treatment plan along with follow-up of her obesity related diagnoses. See Medical Weight Management Flowsheet for complete bioelectrical impedance results.  Today's visit was #: 17 Starting weight: 222 lbs Starting date: 05/25/2019 Weight change since last visit: 1 lb Total lbs lost to date: 46 lbs Total weight loss percentage to date: -20.72%  Nutrition Plan: Keeping a food journal and adhering to recommended goals of 1500 calories and 95 grams of protein daily for 50% of the time. Activity: Swimming/cardio/strength training for 60+ minutes 5-7 times per week.  Anti-obesity medications: Wegovy 2.4 mg subcutaneously weekly, phentermine 37.5 mg daily. Reported side effects: None.  Interim History: Janice Brown has been swimming and changed to sprints.  She has increased her time on the Peloton.  She says she is enjoying life and social events.  Assessment/Plan:   1. Prediabetes with polyphagia Improving, but not optimized. Goal is HgbA1c < 5.7.  Medication: Wegovy 2.4 mg subcutaneously weekly and phentermine 37.5 mg daily.    Plan:  Stop Wegovy and start Mounjaro 10 mg subcutaneously weekly.  Continue phentermine.  She will continue to focus on protein-rich, low simple carbohydrate foods. We reviewed the importance of hydration, regular exercise for stress reduction, and restorative sleep.   Lab Results  Component Value Date   HGBA1C 5.8 (H) 05/25/2019   Lab Results  Component Value Date   INSULIN 17.6 05/25/2019   - Start tirzepatide [facility]) 10 MG/0.5ML Pen; Inject 10 mg into the skin once a week.  Dispense: 2 mL; Refill: 0  2. Obesity BMI today 30  Course: [name] is currently in the action stage of change. As such, her goal is to continue with weight loss efforts.   Nutrition goals: She has agreed to keeping a food journal and adhering to recommended goals of 1500 calories and 95  grams of protein.   Exercise goals:  As is.  Behavioral modification strategies: increasing lean protein intake, decreasing simple carbohydrates, increasing vegetables, and increasing water intake.  Janice Brown has agreed to follow-up with our clinic in 4 weeks. She was informed of the importance of frequent follow-up visits to maximize her success with intensive lifestyle modifications for her multiple health conditions.   Objective:   Blood pressure 98/63, pulse (!) 46, temperature 97.7 F (36.5 C), temperature source Oral, height 5\' 4"  (1.626 m), weight 176 lb (79.8 kg), SpO2 98 %. Body mass index is 30.21 kg/m.  General: Cooperative, alert, well developed, in no acute distress. HEENT: Conjunctivae and lids unremarkable. Cardiovascular: Regular rhythm.  Lungs: Normal work of breathing. Neurologic: No focal deficits.   Lab Results  Component Value Date   CREATININE 0.79 05/25/2019   BUN 10 05/25/2019   NA 144 05/25/2019   K 4.8 05/25/2019   CL 105 05/25/2019   CO2 23 05/25/2019   Lab Results  Component Value Date   ALT 23 05/25/2019   AST 20 05/25/2019   ALKPHOS 111 05/25/2019   BILITOT 0.5 05/25/2019   Lab Results  Component Value Date   HGBA1C 5.8 (H) 05/25/2019   Lab Results  Component Value Date   INSULIN 17.6 05/25/2019   Lab Results  Component Value Date   TSH 0.235 (L) 05/25/2019   Lab Results  Component Value Date   CHOL 193 05/25/2019   HDL 53 05/25/2019   LDLCALC 112 (H) 05/25/2019   TRIG 163 (H) 05/25/2019   CHOLHDL 3.6 05/25/2019  Lab Results  Component Value Date   VD25OH 36.7 05/25/2019   Lab Results  Component Value Date   WBC 8.6 05/25/2019   HGB 14.4 05/25/2019   HCT 44.7 05/25/2019   MCV 84 05/25/2019   PLT 414 05/25/2019   Lab Results  Component Value Date   IRON 109 05/25/2019   TIBC 385 05/25/2019   FERRITIN 62 05/25/2019   Attestation Statements:   Reviewed by clinician on day of visit: allergies, medications, problem  list, medical history, surgical history, family history, social history, and previous encounter notes.  I, Insurance claims handler, CMA, am acting as transcriptionist for Helane Rima, DO  I have reviewed the above documentation for accuracy and completeness, and I agree with the above. -  Helane Rima, DO, MS, FAAFP, DABOM - Family and Bariatric Medicine.

## 2020-11-19 ENCOUNTER — Other Ambulatory Visit (HOSPITAL_COMMUNITY): Payer: Self-pay

## 2020-11-20 ENCOUNTER — Other Ambulatory Visit (HOSPITAL_COMMUNITY): Payer: Self-pay

## 2020-12-09 ENCOUNTER — Other Ambulatory Visit (INDEPENDENT_AMBULATORY_CARE_PROVIDER_SITE_OTHER): Payer: Self-pay

## 2020-12-09 ENCOUNTER — Encounter (INDEPENDENT_AMBULATORY_CARE_PROVIDER_SITE_OTHER): Payer: Self-pay | Admitting: Family Medicine

## 2020-12-09 ENCOUNTER — Other Ambulatory Visit (HOSPITAL_COMMUNITY): Payer: Self-pay

## 2020-12-09 ENCOUNTER — Ambulatory Visit (INDEPENDENT_AMBULATORY_CARE_PROVIDER_SITE_OTHER): Payer: BC Managed Care – PPO | Admitting: Family Medicine

## 2020-12-09 ENCOUNTER — Other Ambulatory Visit: Payer: Self-pay

## 2020-12-09 VITALS — BP 93/58 | HR 62 | Temp 97.5°F | Ht 64.0 in | Wt 176.0 lb

## 2020-12-09 DIAGNOSIS — R7303 Prediabetes: Secondary | ICD-10-CM | POA: Insufficient documentation

## 2020-12-09 DIAGNOSIS — Z6838 Body mass index (BMI) 38.0-38.9, adult: Secondary | ICD-10-CM

## 2020-12-09 DIAGNOSIS — R632 Polyphagia: Secondary | ICD-10-CM

## 2020-12-09 DIAGNOSIS — E039 Hypothyroidism, unspecified: Secondary | ICD-10-CM

## 2020-12-09 DIAGNOSIS — E66812 Obesity, class 2: Secondary | ICD-10-CM

## 2020-12-09 HISTORY — DX: Obesity, class 2: E66.812

## 2020-12-09 MED ORDER — PHENTERMINE HCL 37.5 MG PO TABS
37.5000 mg | ORAL_TABLET | Freq: Every day | ORAL | 0 refills | Status: DC
  Filled 2020-12-09: qty 90, 90d supply, fill #0

## 2020-12-09 MED ORDER — TIRZEPATIDE 12.5 MG/0.5ML ~~LOC~~ SOAJ
12.5000 mg | SUBCUTANEOUS | 0 refills | Status: DC
Start: 2020-12-09 — End: 2020-12-30
  Filled 2020-12-09: qty 2, 28d supply, fill #0

## 2020-12-09 NOTE — Progress Notes (Signed)
Chief Complaint:   OBESITY Janice Brown is here to discuss her progress with her obesity treatment plan along with follow-up of her obesity related diagnoses. Janice Brown is on keeping a food journal and adhering to recommended goals of 1500 calories and 95 grams of protein and states she is following her eating plan approximately 50% of the time. Janice Brown states she is swimming, peloton and weights for 100-150 minutes 6 times per week.  Today's visit was #: 18 Starting weight: 222 lbs Starting date: 05/25/2019 Today's weight: 176 lbs Today's date: 12/09/2020 Total lbs lost to date: 46 lbs Total lbs lost since last in-office visit: 0  Interim History: Janice Brown is quite frustrated with the fact that her weight plateaued over the last 2 months. She is down a few ounces today. She is not journaling. She says that her goal weight is 145 lbs (24 BMI) because this is the weight that she needs to be in order for an insurance discount at work.   Subjective:   1. Prediabetes with polyphagia Janice Brown is tolerating Mounjaro 10 mg well. She feels the dose could be increased.  She was previously on Wegovy 2.4 mg.   Lab Results  Component Value Date   HGBA1C 5.8 (H) 05/25/2019   Lab Results  Component Value Date   INSULIN 17.6 05/25/2019    2. Hypothyroidism, unspecified type Janice Brown is stable on 120 mg Armour thyroid BID.  Lab Results  Component Value Date   TSH 0.235 (L) 05/25/2019     Assessment/Plan:   1. Prediabetes with polyphagia We will refill Mounjaro 12.5 mg weekly. Will have Dr. Earlene Plater refill Phentermine.  I reviewed PDMP, no irregularities noted.   - tirzepatide (MOUNJARO) 12.5 MG/0.5ML Pen; Inject 12.5 mg into the skin once a week.  Dispense: 6 mL; Refill: 0  2. Hypothyroidism, unspecified type Janice Brown will continue Armour thyroid. .  3. Obesity: Current BMI 30.2 Janice Brown is currently in the action stage of change. As such, her goal is to continue with  weight loss efforts. She has agreed to keeping a food journal and adhering to recommended goals of 1500 calories and 95 grams of protein daily.  Janice Brown will journal consistently and she will include all calories including alcohol.   Exercise goals:  As is.  Behavioral modification strategies: decreasing liquid calories and keeping a strict food journal.  Janice Brown has agreed to follow-up with our clinic in 3 weeks.  Objective:   Blood pressure (!) 93/58, pulse 62, temperature (!) 97.5 F (36.4 C), height 5\' 4"  (1.626 m), weight 176 lb (79.8 kg), SpO2 98 %. Body mass index is 30.21 kg/m.  General: Cooperative, alert, well developed, in no acute distress. HEENT: Conjunctivae and lids unremarkable. Cardiovascular: Regular rhythm.  Lungs: Normal work of breathing. Neurologic: No focal deficits.   Lab Results  Component Value Date   CREATININE 0.79 05/25/2019   BUN 10 05/25/2019   NA 144 05/25/2019   K 4.8 05/25/2019   CL 105 05/25/2019   CO2 23 05/25/2019   Lab Results  Component Value Date   ALT 23 05/25/2019   AST 20 05/25/2019   ALKPHOS 111 05/25/2019   BILITOT 0.5 05/25/2019   Lab Results  Component Value Date   HGBA1C 5.8 (H) 05/25/2019   Lab Results  Component Value Date   INSULIN 17.6 05/25/2019   Lab Results  Component Value Date   TSH 0.235 (L) 05/25/2019   Lab Results  Component Value Date   CHOL 193 05/25/2019  HDL 53 05/25/2019   LDLCALC 112 (H) 05/25/2019   TRIG 163 (H) 05/25/2019   CHOLHDL 3.6 05/25/2019   Lab Results  Component Value Date   VD25OH 36.7 05/25/2019   Lab Results  Component Value Date   WBC 8.6 05/25/2019   HGB 14.4 05/25/2019   HCT 44.7 05/25/2019   MCV 84 05/25/2019   PLT 414 05/25/2019   Lab Results  Component Value Date   IRON 109 05/25/2019   TIBC 385 05/25/2019   FERRITIN 62 05/25/2019   Attestation Statements:   Reviewed by clinician on day of visit: allergies, medications, problem list, medical history,  surgical history, family history, social history, and previous encounter notes.  I, Jackson Latino, RMA, am acting as Energy manager for [place], FNP.   I have reviewed the above documentation for accuracy and completeness, and I agree with the above. -  Jesse Sans, FNP

## 2020-12-10 ENCOUNTER — Other Ambulatory Visit (HOSPITAL_COMMUNITY): Payer: Self-pay

## 2020-12-12 ENCOUNTER — Other Ambulatory Visit (HOSPITAL_COMMUNITY): Payer: Self-pay

## 2020-12-30 ENCOUNTER — Other Ambulatory Visit: Payer: Self-pay

## 2020-12-30 ENCOUNTER — Encounter (INDEPENDENT_AMBULATORY_CARE_PROVIDER_SITE_OTHER): Payer: Self-pay | Admitting: Family Medicine

## 2020-12-30 ENCOUNTER — Ambulatory Visit (INDEPENDENT_AMBULATORY_CARE_PROVIDER_SITE_OTHER): Payer: BC Managed Care – PPO | Admitting: Family Medicine

## 2020-12-30 ENCOUNTER — Other Ambulatory Visit (HOSPITAL_COMMUNITY): Payer: Self-pay

## 2020-12-30 VITALS — BP 101/63 | HR 48 | Temp 97.7°F | Ht 64.0 in | Wt 177.0 lb

## 2020-12-30 DIAGNOSIS — R7303 Prediabetes: Secondary | ICD-10-CM | POA: Diagnosis not present

## 2020-12-30 DIAGNOSIS — Z6838 Body mass index (BMI) 38.0-38.9, adult: Secondary | ICD-10-CM | POA: Diagnosis not present

## 2020-12-30 MED ORDER — TIRZEPATIDE 15 MG/0.5ML ~~LOC~~ SOAJ
15.0000 mg | SUBCUTANEOUS | 0 refills | Status: DC
Start: 2020-12-30 — End: 2021-03-04
  Filled 2020-12-30 – 2021-01-20 (×4): qty 2, 28d supply, fill #0
  Filled 2021-02-11: qty 2, 28d supply, fill #1

## 2020-12-31 NOTE — Progress Notes (Signed)
Chief Complaint:   OBESITY Janice Brown is here to discuss her progress with her obesity treatment plan along with follow-up of her obesity related diagnoses. See Medical Weight Management Flowsheet for complete bioelectrical impedance results.  Today's visit was #: 19 Starting weight: 222 lbs Starting date: 05/25/2019 Weight change since last visit: +1 lb Total lbs lost to date: 45 lbs Total weight loss percentage to date: -20.27%  Nutrition Plan: Keeping a food journal and adhering to recommended goals of 1500 calories and 95 grams of protein daily for 65% of the time. Activity: Swimming/cardio/strength training for 40 minutes 5-6 times per week.  Anti-obesity medications: Mounjaro 12.5 mg subcutaneously weekly and phentermine 37.5 mg daily. Reported side effects: None.  Interim History: Shar says she is doing well.  She feels strong.  Assessment/Plan:   1. Prediabetes with polyphagia Not optimized. Goal is HgbA1c < 5.7.  Medication: Mounjaro 12.5 mg subcutaneously weekly.    Plan: Increase Mounjaro to 15 mg subcutaneously weekly, as per below.  Continue phentermine 37.5 mg daily.  She will continue to focus on protein-rich, low simple carbohydrate foods. We reviewed the importance of hydration, regular exercise for stress reduction, and restorative sleep.   Lab Results  Component Value Date   HGBA1C 5.8 (H) 05/25/2019   Lab Results  Component Value Date   INSULIN 17.6 05/25/2019   - Increase tirzepatide (MOUNJARO) 15 MG/0.5ML Pen; Inject 15 mg into the skin once a week.  Dispense: 6 mL; Refill: 0  2. Obesity: Current BMI 30.4  Course: Charrisse is currently in the action stage of change. As such, her goal is to continue with weight loss efforts.   Nutrition goals: She has agreed to keeping a food journal and adhering to recommended goals of 1500 calories and 95 grams of protein.   Exercise goals:  As is.  Behavioral modification strategies: increasing lean  protein intake, decreasing simple carbohydrates, increasing vegetables, and increasing water intake.  Amillion has agreed to follow-up with our clinic in 4 weeks. She was informed of the importance of frequent follow-up visits to maximize her success with intensive lifestyle modifications for her multiple health conditions.   Objective:   Blood pressure 101/63, pulse (!) 48, temperature 97.7 F (36.5 C), temperature source Oral, height 5\' 4"  (1.626 m), weight 177 lb (80.3 kg), SpO2 97 %. Body mass index is 30.38 kg/m.  General: Cooperative, alert, well developed, in no acute distress. HEENT: Conjunctivae and lids unremarkable. Cardiovascular: Regular rhythm.  Lungs: Normal work of breathing. Neurologic: No focal deficits.   Lab Results  Component Value Date   CREATININE 0.79 05/25/2019   BUN 10 05/25/2019   NA 144 05/25/2019   K 4.8 05/25/2019   CL 105 05/25/2019   CO2 23 05/25/2019   Lab Results  Component Value Date   ALT 23 05/25/2019   AST 20 05/25/2019   ALKPHOS 111 05/25/2019   BILITOT 0.5 05/25/2019   Lab Results  Component Value Date   HGBA1C 5.8 (H) 05/25/2019   Lab Results  Component Value Date   INSULIN 17.6 05/25/2019   Lab Results  Component Value Date   TSH 0.235 (L) 05/25/2019   Lab Results  Component Value Date   CHOL 193 05/25/2019   HDL 53 05/25/2019   LDLCALC 112 (H) 05/25/2019   TRIG 163 (H) 05/25/2019   CHOLHDL 3.6 05/25/2019   Lab Results  Component Value Date   VD25OH 36.7 05/25/2019   Lab Results  Component Value Date  WBC 8.6 05/25/2019   HGB 14.4 05/25/2019   HCT 44.7 05/25/2019   MCV 84 05/25/2019   PLT 414 05/25/2019   Lab Results  Component Value Date   IRON 109 05/25/2019   TIBC 385 05/25/2019   FERRITIN 62 05/25/2019   Attestation Statements:   Reviewed by clinician on day of visit: allergies, medications, problem list, medical history, surgical history, family history, social history, and previous encounter  notes.  I, Insurance claims handler, CMA, am acting as transcriptionist for Helane Rima, DO  I have reviewed the above documentation for accuracy and completeness, and I agree with the above. -  Helane Rima, DO, MS, FAAFP, DABOM - Family and Bariatric Medicine.

## 2021-01-07 ENCOUNTER — Other Ambulatory Visit (HOSPITAL_COMMUNITY): Payer: Self-pay

## 2021-01-20 ENCOUNTER — Other Ambulatory Visit (HOSPITAL_COMMUNITY): Payer: Self-pay

## 2021-02-03 ENCOUNTER — Other Ambulatory Visit: Payer: Self-pay

## 2021-02-03 ENCOUNTER — Encounter (INDEPENDENT_AMBULATORY_CARE_PROVIDER_SITE_OTHER): Payer: Self-pay | Admitting: Family Medicine

## 2021-02-03 ENCOUNTER — Ambulatory Visit (INDEPENDENT_AMBULATORY_CARE_PROVIDER_SITE_OTHER): Payer: BC Managed Care – PPO | Admitting: Family Medicine

## 2021-02-03 VITALS — BP 95/50 | HR 44 | Temp 97.6°F | Ht 64.5 in | Wt 175.0 lb

## 2021-02-03 DIAGNOSIS — Z6830 Body mass index (BMI) 30.0-30.9, adult: Secondary | ICD-10-CM

## 2021-02-03 DIAGNOSIS — Z6838 Body mass index (BMI) 38.0-38.9, adult: Secondary | ICD-10-CM

## 2021-02-03 DIAGNOSIS — E038 Other specified hypothyroidism: Secondary | ICD-10-CM | POA: Diagnosis not present

## 2021-02-03 DIAGNOSIS — E669 Obesity, unspecified: Secondary | ICD-10-CM | POA: Diagnosis not present

## 2021-02-03 DIAGNOSIS — R7303 Prediabetes: Secondary | ICD-10-CM | POA: Diagnosis not present

## 2021-02-04 ENCOUNTER — Other Ambulatory Visit (HOSPITAL_COMMUNITY): Payer: Self-pay

## 2021-02-04 MED ORDER — THYROID 120 MG PO TABS
120.0000 mg | ORAL_TABLET | ORAL | 1 refills | Status: DC
  Filled 2021-02-04: qty 16, 56d supply, fill #0

## 2021-02-04 MED ORDER — ARMOUR THYROID 90 MG PO TABS
90.0000 mg | ORAL_TABLET | ORAL | 1 refills | Status: DC
  Filled 2021-02-04: qty 64, 90d supply, fill #0

## 2021-02-05 ENCOUNTER — Other Ambulatory Visit (HOSPITAL_COMMUNITY): Payer: Self-pay

## 2021-02-06 NOTE — Progress Notes (Signed)
Chief Complaint:   OBESITY Janice Brown is here to discuss her progress with her obesity treatment plan along with follow-up of her obesity related diagnoses. See Medical Weight Management Flowsheet for complete bioelectrical impedance results.  Today's visit was #: 20 Starting weight: 222 lbs Starting date: 05/25/2019 Weight change since last visit: 2 lbs Total lbs lost to date: 47 lbs Total weight loss percentage to date: -21.17%  Nutrition Plan: Keeping a food journal and adhering to recommended goals of 1500 calories and 95 grams of protein daily for 50% of the time. Activity: Swimming/cardio/strength training for 30-40 minutes 5 times per week. Anti-obesity medications: Mounjaro 15 mg subcutaneously weekly and phentermine 37.5 mg daily. Reported side effects: None.  Interim History: [name] says she is happy with the plan.  Assessment/Plan:   1. Other specified hypothyroidism Medication: Armour Thyroid 90 mg Monday through Friday and 120 mg on Saturday and Sunday.   Plan:  Continue Armour Thyroid at current dose.  Will refill today, as per below.  Patient was instructed not to take MVM or iron within 4 hours of taking thyroid medications.  We will continue to monitor alongside Endocrinology/PCP as it relates to her weight loss journey.   Lab Results  Component Value Date   TSH 0.235 (L) 05/25/2019   - Refill ARMOUR THYROID 90 MG tablet; Take 1 tablet (90 mg total) by mouth every Monday, Tuesday, Wednesday, Thursday, and Friday.  Dispense: 90 tablet; Refill: 1 - Refill thyroid (ARMOUR) 120 MG tablet; Take 1 tablet (120 mg total) by mouth 2 (two) times a week.  Dispense: 16 tablet; Refill: 1  2. Prediabetes with polyphagia Not optimized. Goal is HgbA1c < 5.7.  Medication: Mounjaro 15 mg subcutaneously weekly.    Plan:  Continue Mounjaro 15 mg subcutaneously weekly. She will continue to focus on protein-rich, low simple carbohydrate foods. We reviewed the importance of  hydration, regular exercise for stress reduction, and restorative sleep.   Lab Results  Component Value Date   HGBA1C 5.8 (H) 05/25/2019   Lab Results  Component Value Date   INSULIN 17.6 05/25/2019   3. Obesity: Current BMI 30  Course: [name] is currently in the action stage of change. As such, her goal is to continue with weight loss efforts.   Nutrition goals: She has agreed to keeping a food journal and adhering to recommended goals of 1500 calories and 95 grams of protein.   Exercise goals:  As is.  Behavioral modification strategies: increasing lean protein intake, decreasing simple carbohydrates, increasing vegetables, and increasing water intake.  [name] has agreed to follow-up with our clinic in 4 weeks. She was informed of the importance of frequent follow-up visits to maximize her success with intensive lifestyle modifications for her multiple health conditions.   Objective:   Blood pressure (!) 95/50, pulse (!) 44, temperature 97.6 F (36.4 C), temperature source Oral, height 5' 4.5" (1.638 m), weight 175 lb (79.4 kg), SpO2 97 %. Body mass index is 29.57 kg/m.  General: Cooperative, alert, well developed, in no acute distress. HEENT: Conjunctivae and lids unremarkable. Cardiovascular: Regular rhythm.  Lungs: Normal work of breathing. Neurologic: No focal deficits.   Lab Results  Component Value Date   CREATININE 0.79 05/25/2019   BUN 10 05/25/2019   NA 144 05/25/2019   K 4.8 05/25/2019   CL 105 05/25/2019   CO2 23 05/25/2019   Lab Results  Component Value Date   ALT 23 05/25/2019   AST 20 05/25/2019   ALKPHOS  111 05/25/2019   BILITOT 0.5 05/25/2019   Lab Results  Component Value Date   HGBA1C 5.8 (H) 05/25/2019   Lab Results  Component Value Date   INSULIN 17.6 05/25/2019   Lab Results  Component Value Date   TSH 0.235 (L) 05/25/2019   Lab Results  Component Value Date   CHOL 193 05/25/2019   HDL 53 05/25/2019   LDLCALC 112 (H)  05/25/2019   TRIG 163 (H) 05/25/2019   CHOLHDL 3.6 05/25/2019   Lab Results  Component Value Date   VD25OH 36.7 05/25/2019   Lab Results  Component Value Date   WBC 8.6 05/25/2019   HGB 14.4 05/25/2019   HCT 44.7 05/25/2019   MCV 84 05/25/2019   PLT 414 05/25/2019   Lab Results  Component Value Date   IRON 109 05/25/2019   TIBC 385 05/25/2019   FERRITIN 62 05/25/2019   Attestation Statements:   Reviewed by clinician on day of visit: allergies, medications, problem list, medical history, surgical history, family history, social history, and previous encounter notes.  I, Insurance claims handler, CMA, am acting as transcriptionist for Helane Rima, DO  I have reviewed the above documentation for accuracy and completeness, and I agree with the above. -  Helane Rima, DO, MS, FAAFP, DABOM - Family and Bariatric Medicine.

## 2021-02-11 ENCOUNTER — Other Ambulatory Visit (HOSPITAL_COMMUNITY): Payer: Self-pay

## 2021-03-04 ENCOUNTER — Other Ambulatory Visit (HOSPITAL_COMMUNITY): Payer: Self-pay

## 2021-03-04 ENCOUNTER — Other Ambulatory Visit: Payer: Self-pay

## 2021-03-04 ENCOUNTER — Ambulatory Visit (INDEPENDENT_AMBULATORY_CARE_PROVIDER_SITE_OTHER): Payer: BC Managed Care – PPO | Admitting: Family Medicine

## 2021-03-04 ENCOUNTER — Encounter (INDEPENDENT_AMBULATORY_CARE_PROVIDER_SITE_OTHER): Payer: Self-pay | Admitting: Family Medicine

## 2021-03-04 VITALS — BP 104/68 | HR 47 | Temp 97.7°F | Ht 64.5 in | Wt 177.0 lb

## 2021-03-04 DIAGNOSIS — E669 Obesity, unspecified: Secondary | ICD-10-CM | POA: Diagnosis not present

## 2021-03-04 DIAGNOSIS — E038 Other specified hypothyroidism: Secondary | ICD-10-CM | POA: Diagnosis not present

## 2021-03-04 DIAGNOSIS — Z6829 Body mass index (BMI) 29.0-29.9, adult: Secondary | ICD-10-CM | POA: Diagnosis not present

## 2021-03-04 DIAGNOSIS — R7303 Prediabetes: Secondary | ICD-10-CM

## 2021-03-04 DIAGNOSIS — Z6838 Body mass index (BMI) 38.0-38.9, adult: Secondary | ICD-10-CM

## 2021-03-04 MED ORDER — TIRZEPATIDE 15 MG/0.5ML ~~LOC~~ SOAJ
15.0000 mg | SUBCUTANEOUS | 3 refills | Status: DC
  Filled 2021-03-04 – 2021-03-14 (×2): qty 2, 28d supply, fill #0
  Filled 2021-04-14: qty 2, 28d supply, fill #1

## 2021-03-04 NOTE — Progress Notes (Signed)
Chief Complaint:   OBESITY Janice Brown is here to discuss her progress with her obesity treatment plan along with follow-up of her obesity related diagnoses. See Medical Weight Management Flowsheet for complete bioelectrical impedance results.  Today's visit was #: 21 Starting weight: 222 lbs Starting date: 05/25/2019 Weight change since last visit: +2 lbs Total lbs lost to date: 45 lbs Total weight loss percentage to date: -20.27%  Nutrition Plan: Keeping a food journal and adhering to recommended goals of 1500 calories and 95 grams of protein daily for 15-20% of the time. Activity: Swimming/cardio/strength training for 30-70 minutes 5 times per week. Anti-obesity medications: Mounjaro 15 mg subcutaneously weekly and phentermine 37.5 mg daily. Reported side effects: None.  Interim History: Janice Brown says she is back to the office 3 times per week. Challenge:  Having to bring all supplies.  Assessment/Plan:   1. Prediabetes with polyphagia Improving, but not optimized. Goal is HgbA1c < 5.7.  Medication: Mounjaro 15 mg subcutaneously weekly.    Plan:  Continue Mounjaro 15 mg subcutaneously weekly.  Will refill today. She will continue to focus on protein-rich, low simple carbohydrate foods. We reviewed the importance of hydration, regular exercise for stress reduction, and restorative sleep.   Lab Results  Component Value Date   HGBA1C 5.8 (H) 05/25/2019   Lab Results  Component Value Date   INSULIN 17.6 05/25/2019   - Refill tirzepatide (MOUNJARO) 15 MG/0.5ML Pen; Inject 15 mg into the skin once a week.  Dispense: 6 mL; Refill: 3  2. Other specified hypothyroidism Medication: Armour Thyroid 120 mg twice weekly.   Plan: The current medical regimen is effective;  continue present plan and medications.  Lab Results  Component Value Date   TSH 0.235 (L) 05/25/2019   3. Obesity: Current BMI 29.9  Course: Janice Brown is currently in the action stage of change. As such, her  goal is to continue with weight loss efforts.   Nutrition goals: She has agreed to keeping a food journal and adhering to recommended goals of 1500 calories and 95 grams of protein.   Exercise goals:  As is.  Behavioral modification strategies: increasing lean protein intake, decreasing simple carbohydrates, increasing vegetables, and increasing water intake.  Janice Brown has agreed to follow-up with our clinic in 4 weeks. She was informed of the importance of frequent follow-up visits to maximize her success with intensive lifestyle modifications for her multiple health conditions.   Objective:   Blood pressure 104/68, pulse (!) 47, temperature 97.7 F (36.5 C), temperature source Oral, height 5' 4.5" (1.638 m), weight 177 lb (80.3 kg), SpO2 96 %. Body mass index is 29.91 kg/m.  General: Cooperative, alert, well developed, in no acute distress. HEENT: Conjunctivae and lids unremarkable. Cardiovascular: Regular rhythm.  Lungs: Normal work of breathing. Neurologic: No focal deficits.   Lab Results  Component Value Date   CREATININE 0.79 05/25/2019   BUN 10 05/25/2019   NA 144 05/25/2019   K 4.8 05/25/2019   CL 105 05/25/2019   CO2 23 05/25/2019   Lab Results  Component Value Date   ALT 23 05/25/2019   AST 20 05/25/2019   ALKPHOS 111 05/25/2019   BILITOT 0.5 05/25/2019   Lab Results  Component Value Date   HGBA1C 5.8 (H) 05/25/2019   Lab Results  Component Value Date   INSULIN 17.6 05/25/2019   Lab Results  Component Value Date   TSH 0.235 (L) 05/25/2019   Lab Results  Component Value Date   CHOL 193  05/25/2019   HDL 53 05/25/2019   LDLCALC 112 (H) 05/25/2019   TRIG 163 (H) 05/25/2019   CHOLHDL 3.6 05/25/2019   Lab Results  Component Value Date   VD25OH 36.7 05/25/2019   Lab Results  Component Value Date   WBC 8.6 05/25/2019   HGB 14.4 05/25/2019   HCT 44.7 05/25/2019   MCV 84 05/25/2019   PLT 414 05/25/2019   Lab Results  Component Value Date    IRON 109 05/25/2019   TIBC 385 05/25/2019   FERRITIN 62 05/25/2019   Attestation Statements:   Reviewed by clinician on day of visit: allergies, medications, problem list, medical history, surgical history, family history, social history, and previous encounter notes.  I, Insurance claims handler, CMA, am acting as transcriptionist for Helane Rima, DO  I have reviewed the above documentation for accuracy and completeness, and I agree with the above. -  Helane Rima, DO, MS, FAAFP, DABOM - Family and Bariatric Medicine.

## 2021-03-12 ENCOUNTER — Other Ambulatory Visit (HOSPITAL_COMMUNITY): Payer: Self-pay

## 2021-03-14 ENCOUNTER — Other Ambulatory Visit (HOSPITAL_COMMUNITY): Payer: Self-pay

## 2021-03-31 ENCOUNTER — Other Ambulatory Visit (INDEPENDENT_AMBULATORY_CARE_PROVIDER_SITE_OTHER): Payer: Self-pay | Admitting: Family Medicine

## 2021-03-31 DIAGNOSIS — R632 Polyphagia: Secondary | ICD-10-CM

## 2021-04-01 ENCOUNTER — Other Ambulatory Visit (HOSPITAL_COMMUNITY): Payer: Self-pay

## 2021-04-01 ENCOUNTER — Ambulatory Visit (INDEPENDENT_AMBULATORY_CARE_PROVIDER_SITE_OTHER): Payer: BC Managed Care – PPO | Admitting: Family Medicine

## 2021-04-01 NOTE — Telephone Encounter (Signed)
Dr.Wallace °

## 2021-04-02 ENCOUNTER — Other Ambulatory Visit (INDEPENDENT_AMBULATORY_CARE_PROVIDER_SITE_OTHER): Payer: Self-pay | Admitting: Family Medicine

## 2021-04-02 ENCOUNTER — Other Ambulatory Visit (HOSPITAL_COMMUNITY): Payer: Self-pay

## 2021-04-02 DIAGNOSIS — R632 Polyphagia: Secondary | ICD-10-CM

## 2021-04-03 ENCOUNTER — Other Ambulatory Visit (HOSPITAL_COMMUNITY): Payer: Self-pay

## 2021-04-03 ENCOUNTER — Encounter (INDEPENDENT_AMBULATORY_CARE_PROVIDER_SITE_OTHER): Payer: Self-pay | Admitting: Family Medicine

## 2021-04-03 ENCOUNTER — Other Ambulatory Visit (INDEPENDENT_AMBULATORY_CARE_PROVIDER_SITE_OTHER): Payer: Self-pay | Admitting: Family Medicine

## 2021-04-03 DIAGNOSIS — R632 Polyphagia: Secondary | ICD-10-CM

## 2021-04-04 ENCOUNTER — Other Ambulatory Visit (HOSPITAL_COMMUNITY): Payer: Self-pay

## 2021-04-04 MED ORDER — PHENTERMINE HCL 37.5 MG PO TABS
37.5000 mg | ORAL_TABLET | Freq: Every day | ORAL | 0 refills | Status: DC
  Filled 2021-04-04: qty 90, 90d supply, fill #0

## 2021-04-14 ENCOUNTER — Other Ambulatory Visit (HOSPITAL_COMMUNITY): Payer: Self-pay

## 2021-04-24 ENCOUNTER — Encounter (INDEPENDENT_AMBULATORY_CARE_PROVIDER_SITE_OTHER): Payer: Self-pay | Admitting: Family Medicine

## 2021-04-24 ENCOUNTER — Other Ambulatory Visit (HOSPITAL_COMMUNITY): Payer: Self-pay

## 2021-04-24 ENCOUNTER — Telehealth (INDEPENDENT_AMBULATORY_CARE_PROVIDER_SITE_OTHER): Payer: BC Managed Care – PPO | Admitting: Family Medicine

## 2021-04-24 DIAGNOSIS — E038 Other specified hypothyroidism: Secondary | ICD-10-CM

## 2021-04-24 DIAGNOSIS — R7303 Prediabetes: Secondary | ICD-10-CM

## 2021-04-24 DIAGNOSIS — Z6829 Body mass index (BMI) 29.0-29.9, adult: Secondary | ICD-10-CM

## 2021-04-24 DIAGNOSIS — E669 Obesity, unspecified: Secondary | ICD-10-CM

## 2021-04-24 MED ORDER — THYROID 120 MG PO TABS
120.0000 mg | ORAL_TABLET | ORAL | 1 refills | Status: DC
Start: 2021-04-24 — End: 2023-12-03
  Filled 2021-04-24: qty 16, 56d supply, fill #0

## 2021-04-24 MED ORDER — TIRZEPATIDE 15 MG/0.5ML ~~LOC~~ SOAJ
15.0000 mg | SUBCUTANEOUS | 3 refills | Status: DC
  Filled 2021-04-24 – 2021-05-08 (×2): qty 2, 28d supply, fill #0
  Filled 2021-06-05: qty 2, 28d supply, fill #1
  Filled 2021-07-03 – 2021-08-14 (×3): qty 2, 28d supply, fill #2
  Filled 2021-09-16 – 2021-09-17 (×4): qty 2, 28d supply, fill #3
  Filled 2021-10-09: qty 2, 28d supply, fill #4
  Filled 2021-11-06: qty 2, 28d supply, fill #5
  Filled 2021-12-04: qty 2, 28d supply, fill #6
  Filled 2022-01-01: qty 2, 28d supply, fill #7

## 2021-04-24 MED ORDER — ARMOUR THYROID 90 MG PO TABS
90.0000 mg | ORAL_TABLET | ORAL | 1 refills | Status: DC
  Filled 2021-04-24: qty 65, 90d supply, fill #0

## 2021-04-24 NOTE — Progress Notes (Signed)
TeleHealth Visit:  Due to the COVID-19 pandemic, this visit was completed with telemedicine (audio/video) technology to reduce patient and provider exposure as well as to preserve personal protective equipment.   Janice Brown has verbally consented to this TeleHealth visit. The patient is located at home, the provider is located at home. The participants in this visit include the listed provider and patient. The visit was conducted today via MyChart video.  OBESITY Janice Brown is here to discuss her progress with her obesity treatment plan along with follow-up of her obesity related diagnoses.   Today's visit was #: 22 Starting weight: 222 lbs Starting date: 05/25/2019 Today's date: 04/24/2021  Weight at home: 177 lbs (same)  Nutrition Plan: Keeping a food journal and adhering to recommended goals of 1500 calories and 95 grams of protein daily for 0% of the time.  Anti-obesity medications: Mounjaro 15 mg subcutaneously weekly and phentermine 37.5 mg daily. Reported side effects: None. Activity: Swimming/cardio/strength training for 30-60 minutes 6-7 times per week  Assessment/Plan:   Diagnoses and all orders for this visit:  Prediabetes with polyphagia At goal. Goal is HgbA1c < 5.7.  Medication: Mounjaro.    Plan: She will continue to focus on protein-rich, low simple carbohydrate foods. We reviewed the importance of hydration, regular exercise for stress reduction, and restorative sleep.   Lab Results  Component Value Date   HGBA1C 5.8 (H) 05/25/2019   Lab Results  Component Value Date   INSULIN 17.6 05/25/2019   -     tirzepatide (MOUNJARO) 15 MG/0.5ML Pen; Inject 15 mg into the skin once a week.  Other specified hypothyroidism The current medical regimen is effective;  continue present plan and medications.  -     ARMOUR THYROID 90 MG tablet; Take 1 tablet (90 mg total) by mouth every Monday, Tuesday, Wednesday, Thursday, and Friday. -     thyroid (ARMOUR) 120 MG tablet;  Take 1 tablet (120 mg total) by mouth 2 (two) times a week.  Obesity: Current BMI 29.9  Janice Brown is currently in the action stage of change. As such, her goal is to continue with weight loss efforts. She has agreed to Keeping a food journal and adhering to recommended goals of 1500 calories and 95 grams of protein daily.   Exercise goals: As is.  Behavioral modification strategies: increasing lean protein intake, decreasing simple carbohydrates, increasing vegetables, and increasing water intake.  Janice Brown has agreed to follow-up with our clinic in 4 weeks. She was informed of the importance of frequent follow-up visits to maximize her success with intensive lifestyle modifications for her multiple health conditions.  Objective:   VITALS: Per patient if applicable, see vitals. GENERAL: Alert and in no acute distress. CARDIOPULMONARY: No increased WOB. Speaking in clear sentences.  PSYCH: Pleasant and cooperative. Speech normal rate and rhythm. Affect is appropriate. Insight and judgement are appropriate. Attention is focused, linear, and appropriate.  NEURO: Oriented as arrived to appointment on time with no prompting.   Lab Results  Component Value Date   CREATININE 0.79 05/25/2019   BUN 10 05/25/2019   NA 144 05/25/2019   K 4.8 05/25/2019   CL 105 05/25/2019   CO2 23 05/25/2019   Lab Results  Component Value Date   ALT 23 05/25/2019   AST 20 05/25/2019   ALKPHOS 111 05/25/2019   BILITOT 0.5 05/25/2019   Lab Results  Component Value Date   HGBA1C 5.8 (H) 05/25/2019   Lab Results  Component Value Date   INSULIN 17.6  05/25/2019   Lab Results  Component Value Date   TSH 0.235 (L) 05/25/2019   Lab Results  Component Value Date   CHOL 193 05/25/2019   HDL 53 05/25/2019   LDLCALC 112 (H) 05/25/2019   TRIG 163 (H) 05/25/2019   CHOLHDL 3.6 05/25/2019   Lab Results  Component Value Date   WBC 8.6 05/25/2019   HGB 14.4 05/25/2019   HCT 44.7 05/25/2019   MCV 84  05/25/2019   PLT 414 05/25/2019   Lab Results  Component Value Date   IRON 109 05/25/2019   TIBC 385 05/25/2019   FERRITIN 62 05/25/2019    Attestation Statements:   Reviewed by clinician on day of visit: allergies, medications, problem list, medical history, surgical history, family history, social history, and previous encounter notes.

## 2021-05-08 ENCOUNTER — Other Ambulatory Visit (HOSPITAL_COMMUNITY): Payer: Self-pay

## 2021-06-06 ENCOUNTER — Other Ambulatory Visit (HOSPITAL_COMMUNITY): Payer: Self-pay

## 2021-06-26 ENCOUNTER — Other Ambulatory Visit (HOSPITAL_COMMUNITY): Payer: Self-pay

## 2021-06-26 MED ORDER — MOUNJARO 7.5 MG/0.5ML ~~LOC~~ SOAJ
7.5000 mg | SUBCUTANEOUS | 2 refills | Status: DC
Start: 2021-06-26 — End: 2023-08-18
  Filled 2021-06-26 – 2021-07-04 (×2): qty 2, 28d supply, fill #0

## 2021-06-26 MED ORDER — VYVANSE 20 MG PO CAPS
20.0000 mg | ORAL_CAPSULE | Freq: Every morning | ORAL | 0 refills | Status: DC
  Filled 2021-06-26: qty 30, 30d supply, fill #0

## 2021-06-27 ENCOUNTER — Other Ambulatory Visit (HOSPITAL_COMMUNITY): Payer: Self-pay

## 2021-06-30 ENCOUNTER — Other Ambulatory Visit (HOSPITAL_COMMUNITY): Payer: Self-pay

## 2021-07-04 ENCOUNTER — Other Ambulatory Visit (HOSPITAL_COMMUNITY): Payer: Self-pay

## 2021-07-23 ENCOUNTER — Other Ambulatory Visit (HOSPITAL_COMMUNITY): Payer: Self-pay

## 2021-07-24 ENCOUNTER — Other Ambulatory Visit (HOSPITAL_COMMUNITY): Payer: Self-pay

## 2021-07-24 MED ORDER — VYVANSE 20 MG PO CAPS
20.0000 mg | ORAL_CAPSULE | Freq: Every morning | ORAL | 0 refills | Status: DC
  Filled 2021-07-24 – 2021-07-25 (×2): qty 30, 30d supply, fill #0

## 2021-07-25 ENCOUNTER — Other Ambulatory Visit (HOSPITAL_COMMUNITY): Payer: Self-pay

## 2021-08-06 ENCOUNTER — Other Ambulatory Visit (HOSPITAL_COMMUNITY): Payer: Self-pay

## 2021-08-14 ENCOUNTER — Other Ambulatory Visit (HOSPITAL_COMMUNITY): Payer: Self-pay

## 2021-08-14 MED ORDER — VYVANSE 20 MG PO CAPS
20.0000 mg | ORAL_CAPSULE | ORAL | 0 refills | Status: DC
  Filled 2021-08-26: qty 30, 30d supply, fill #0

## 2021-08-15 ENCOUNTER — Other Ambulatory Visit (HOSPITAL_COMMUNITY): Payer: Self-pay

## 2021-08-20 ENCOUNTER — Encounter (INDEPENDENT_AMBULATORY_CARE_PROVIDER_SITE_OTHER): Payer: Self-pay

## 2021-08-26 ENCOUNTER — Other Ambulatory Visit (HOSPITAL_BASED_OUTPATIENT_CLINIC_OR_DEPARTMENT_OTHER): Payer: Self-pay

## 2021-08-26 ENCOUNTER — Other Ambulatory Visit (HOSPITAL_COMMUNITY): Payer: Self-pay

## 2021-09-16 ENCOUNTER — Other Ambulatory Visit (HOSPITAL_COMMUNITY): Payer: Self-pay

## 2021-09-17 ENCOUNTER — Other Ambulatory Visit (HOSPITAL_COMMUNITY): Payer: Self-pay

## 2021-09-22 ENCOUNTER — Other Ambulatory Visit (HOSPITAL_COMMUNITY): Payer: Self-pay

## 2021-09-22 MED ORDER — LISDEXAMFETAMINE DIMESYLATE 20 MG PO CAPS
20.0000 mg | ORAL_CAPSULE | Freq: Every morning | ORAL | 0 refills | Status: DC
  Filled 2021-09-22: qty 30, 30d supply, fill #0

## 2021-09-22 MED ORDER — MELOXICAM 15 MG PO TABS
15.0000 mg | ORAL_TABLET | Freq: Every day | ORAL | 0 refills | Status: DC
  Filled 2021-09-22: qty 7, 7d supply, fill #0

## 2021-10-10 ENCOUNTER — Other Ambulatory Visit (HOSPITAL_COMMUNITY): Payer: Self-pay

## 2021-10-16 ENCOUNTER — Other Ambulatory Visit (HOSPITAL_COMMUNITY): Payer: Self-pay

## 2021-10-16 MED ORDER — PHENTERMINE HCL 37.5 MG PO TABS
37.5000 mg | ORAL_TABLET | Freq: Every day | ORAL | 0 refills | Status: DC
  Filled 2021-10-16: qty 90, 90d supply, fill #0

## 2021-10-17 ENCOUNTER — Other Ambulatory Visit (HOSPITAL_COMMUNITY): Payer: Self-pay

## 2021-11-06 ENCOUNTER — Other Ambulatory Visit (HOSPITAL_COMMUNITY): Payer: Self-pay

## 2021-11-18 ENCOUNTER — Other Ambulatory Visit (HOSPITAL_COMMUNITY): Payer: Self-pay

## 2021-11-18 MED ORDER — MOUNJARO 15 MG/0.5ML ~~LOC~~ SOAJ
15.0000 mg | SUBCUTANEOUS | 1 refills | Status: DC
  Filled 2021-11-18: qty 2, 28d supply, fill #0

## 2021-11-18 MED ORDER — PHENTERMINE HCL 37.5 MG PO TABS
37.5000 mg | ORAL_TABLET | Freq: Every day | ORAL | 0 refills | Status: DC
  Filled 2021-11-18 – 2022-01-14 (×2): qty 90, 90d supply, fill #0

## 2021-12-05 ENCOUNTER — Other Ambulatory Visit (HOSPITAL_COMMUNITY): Payer: Self-pay

## 2021-12-09 ENCOUNTER — Other Ambulatory Visit (HOSPITAL_COMMUNITY): Payer: Self-pay

## 2021-12-09 MED ORDER — MOUNJARO 15 MG/0.5ML ~~LOC~~ SOAJ
15.0000 mg | SUBCUTANEOUS | 1 refills | Status: DC
  Filled 2022-02-06: qty 2, 28d supply, fill #0

## 2021-12-10 ENCOUNTER — Other Ambulatory Visit (HOSPITAL_COMMUNITY): Payer: Self-pay

## 2021-12-10 MED ORDER — PHENTERMINE HCL 37.5 MG PO TABS: 37.5000 mg | ORAL_TABLET | Freq: Every day | ORAL | 0 refills | Status: DC

## 2022-01-14 ENCOUNTER — Other Ambulatory Visit (HOSPITAL_COMMUNITY): Payer: Self-pay

## 2022-01-19 ENCOUNTER — Other Ambulatory Visit (HOSPITAL_COMMUNITY): Payer: Self-pay

## 2022-01-20 ENCOUNTER — Other Ambulatory Visit (HOSPITAL_COMMUNITY): Payer: Self-pay

## 2022-01-20 MED ORDER — THYROID 90 MG PO TABS
90.0000 mg | ORAL_TABLET | Freq: Every day | ORAL | 1 refills | Status: DC
  Filled 2022-01-20: qty 90, 90d supply, fill #0

## 2022-02-05 ENCOUNTER — Other Ambulatory Visit (HOSPITAL_COMMUNITY): Payer: Self-pay

## 2022-02-05 MED ORDER — MELOXICAM 15 MG PO TABS
15.0000 mg | ORAL_TABLET | Freq: Every day | ORAL | 0 refills | Status: DC | PRN
  Filled 2022-02-05: qty 30, 30d supply, fill #0

## 2022-02-06 ENCOUNTER — Other Ambulatory Visit (HOSPITAL_COMMUNITY): Payer: Self-pay

## 2022-02-10 ENCOUNTER — Other Ambulatory Visit (HOSPITAL_COMMUNITY): Payer: Self-pay

## 2022-03-04 ENCOUNTER — Other Ambulatory Visit (HOSPITAL_COMMUNITY): Payer: Self-pay

## 2022-03-04 MED ORDER — MOUNJARO 10 MG/0.5ML ~~LOC~~ SOAJ
10.0000 mg | SUBCUTANEOUS | 0 refills | Status: DC
  Filled 2022-03-04: qty 2, 28d supply, fill #0

## 2022-03-16 ENCOUNTER — Other Ambulatory Visit (HOSPITAL_COMMUNITY): Payer: Self-pay

## 2022-03-30 ENCOUNTER — Other Ambulatory Visit (HOSPITAL_COMMUNITY): Payer: Self-pay

## 2022-03-30 MED ORDER — MOUNJARO 15 MG/0.5ML ~~LOC~~ SOAJ
15.0000 mg | SUBCUTANEOUS | 1 refills | Status: DC
  Filled 2022-03-30: qty 2, 28d supply, fill #0

## 2022-04-07 ENCOUNTER — Other Ambulatory Visit: Payer: Self-pay | Admitting: Sports Medicine

## 2022-04-07 ENCOUNTER — Ambulatory Visit
Admission: RE | Admit: 2022-04-07 | Discharge: 2022-04-07 | Disposition: A | Discharge status: Home / Self Care | Payer: No Typology Code available for payment source | Source: Ambulatory Visit | Attending: Sports Medicine | Admitting: Sports Medicine

## 2022-04-07 ENCOUNTER — Other Ambulatory Visit (HOSPITAL_COMMUNITY): Payer: Self-pay

## 2022-04-07 DIAGNOSIS — M25551 Pain in right hip: Secondary | ICD-10-CM

## 2022-04-07 MED ORDER — THYROID 90 MG PO TABS
90.0000 mg | ORAL_TABLET | Freq: Every day | ORAL | 1 refills | Status: DC
  Filled 2022-04-07: qty 90, 90d supply, fill #0
  Filled 2022-07-08: qty 90, 90d supply, fill #1

## 2022-04-22 ENCOUNTER — Other Ambulatory Visit (HOSPITAL_COMMUNITY): Payer: Self-pay

## 2022-04-22 MED ORDER — PHENTERMINE HCL 37.5 MG PO TABS
37.5000 mg | ORAL_TABLET | Freq: Every day | ORAL | 0 refills | Status: DC
  Filled 2022-04-22: qty 90, 90d supply, fill #0

## 2022-06-29 ENCOUNTER — Other Ambulatory Visit (HOSPITAL_COMMUNITY): Payer: Self-pay

## 2022-06-29 MED ORDER — MOUNJARO 12.5 MG/0.5ML ~~LOC~~ SOAJ
12.5000 mg | SUBCUTANEOUS | 1 refills | Status: DC
  Filled 2022-06-29: qty 2, 28d supply, fill #0

## 2022-06-29 MED ORDER — MOUNJARO 15 MG/0.5ML ~~LOC~~ SOAJ
15.0000 mg | SUBCUTANEOUS | 1 refills | Status: DC
  Filled 2022-06-29: qty 2, 28d supply, fill #0

## 2022-07-08 ENCOUNTER — Other Ambulatory Visit (HOSPITAL_COMMUNITY): Payer: Self-pay

## 2022-07-09 ENCOUNTER — Other Ambulatory Visit (HOSPITAL_COMMUNITY): Payer: Self-pay

## 2022-07-09 MED ORDER — PHENTERMINE HCL 37.5 MG PO TABS
37.5000 mg | ORAL_TABLET | Freq: Every day | ORAL | 0 refills | Status: DC
  Filled 2022-07-09 – 2022-11-19 (×2): qty 90, 90d supply, fill #0

## 2022-07-13 ENCOUNTER — Other Ambulatory Visit (HOSPITAL_COMMUNITY): Payer: Self-pay

## 2022-07-13 MED ORDER — MOUNJARO 12.5 MG/0.5ML ~~LOC~~ SOAJ
12.5000 mg | SUBCUTANEOUS | 1 refills | Status: DC
  Filled 2022-07-13: qty 2, 28d supply, fill #0

## 2022-07-14 ENCOUNTER — Other Ambulatory Visit (HOSPITAL_COMMUNITY): Payer: Self-pay

## 2022-07-14 MED ORDER — PHENTERMINE HCL 37.5 MG PO TABS
37.5000 mg | ORAL_TABLET | Freq: Every morning | ORAL | 1 refills | Status: DC
  Filled 2022-07-14 – 2022-07-20 (×2): qty 90, 90d supply, fill #0
  Filled 2022-11-20: qty 90, 90d supply, fill #1

## 2022-07-15 ENCOUNTER — Other Ambulatory Visit (HOSPITAL_COMMUNITY): Payer: Self-pay

## 2022-07-17 ENCOUNTER — Other Ambulatory Visit (HOSPITAL_COMMUNITY): Payer: Self-pay

## 2022-07-20 ENCOUNTER — Other Ambulatory Visit (HOSPITAL_COMMUNITY): Payer: Self-pay

## 2022-07-20 ENCOUNTER — Other Ambulatory Visit: Payer: Self-pay

## 2022-07-21 ENCOUNTER — Other Ambulatory Visit (HOSPITAL_COMMUNITY): Payer: Self-pay

## 2022-07-21 MED ORDER — ZEPBOUND 15 MG/0.5ML ~~LOC~~ SOAJ
15.0000 mg | SUBCUTANEOUS | 1 refills | Status: DC
  Filled 2022-07-21: qty 2, 28d supply, fill #0
  Filled 2022-08-28: qty 2, 28d supply, fill #1

## 2022-07-22 ENCOUNTER — Other Ambulatory Visit (HOSPITAL_COMMUNITY): Payer: Self-pay

## 2022-09-02 ENCOUNTER — Other Ambulatory Visit (HOSPITAL_COMMUNITY): Payer: Self-pay

## 2022-09-02 MED ORDER — PHENTERMINE HCL 37.5 MG PO TABS
37.5000 mg | ORAL_TABLET | Freq: Every day | ORAL | 1 refills | Status: DC
  Filled 2022-11-19: qty 90, 90d supply, fill #0

## 2022-09-15 ENCOUNTER — Other Ambulatory Visit (HOSPITAL_COMMUNITY): Payer: Self-pay

## 2022-09-15 MED ORDER — DULOXETINE HCL 30 MG PO CPEP
30.0000 mg | ORAL_CAPSULE | Freq: Every day | ORAL | 1 refills | Status: DC
  Filled 2022-09-15: qty 30, 30d supply, fill #0
  Filled 2022-10-11: qty 30, 30d supply, fill #1

## 2022-09-17 ENCOUNTER — Other Ambulatory Visit: Payer: Self-pay | Admitting: Family Medicine

## 2022-09-17 DIAGNOSIS — Z1231 Encounter for screening mammogram for malignant neoplasm of breast: Secondary | ICD-10-CM

## 2022-09-29 ENCOUNTER — Other Ambulatory Visit (HOSPITAL_COMMUNITY): Payer: Self-pay

## 2022-09-30 ENCOUNTER — Other Ambulatory Visit (HOSPITAL_COMMUNITY): Payer: Self-pay

## 2022-09-30 MED ORDER — ZEPBOUND 15 MG/0.5ML ~~LOC~~ SOAJ
15.0000 mg | SUBCUTANEOUS | 1 refills | Status: DC
  Filled 2022-09-30: qty 2, 28d supply, fill #0
  Filled 2022-10-19 – 2022-10-24 (×2): qty 2, 28d supply, fill #1

## 2022-09-30 MED ORDER — ZEPBOUND 15 MG/0.5ML ~~LOC~~ SOAJ
15.0000 mg | SUBCUTANEOUS | 1 refills | Status: DC
  Filled 2022-09-30 – 2022-11-19 (×3): qty 2, 28d supply, fill #0
  Filled 2022-12-18: qty 2, 28d supply, fill #1

## 2022-09-30 MED ORDER — PHENTERMINE HCL 37.5 MG PO TABS
37.5000 mg | ORAL_TABLET | ORAL | 2 refills | Status: DC
Start: 2022-09-30 — End: 2023-12-03
  Filled 2022-10-11 – 2022-10-19 (×2): qty 30, 30d supply, fill #0

## 2022-10-11 ENCOUNTER — Other Ambulatory Visit (HOSPITAL_COMMUNITY): Payer: Self-pay

## 2022-10-12 ENCOUNTER — Other Ambulatory Visit (HOSPITAL_COMMUNITY): Payer: Self-pay

## 2022-10-12 ENCOUNTER — Other Ambulatory Visit: Payer: Self-pay

## 2022-10-12 MED ORDER — THYROID 90 MG PO TABS
90.0000 mg | ORAL_TABLET | Freq: Every day | ORAL | 1 refills | Status: DC
  Filled 2022-10-12: qty 90, 90d supply, fill #0
  Filled 2023-01-08: qty 90, 90d supply, fill #1

## 2022-10-19 ENCOUNTER — Other Ambulatory Visit: Payer: Self-pay

## 2022-10-19 ENCOUNTER — Other Ambulatory Visit (HOSPITAL_COMMUNITY): Payer: Self-pay

## 2022-11-09 ENCOUNTER — Other Ambulatory Visit (HOSPITAL_COMMUNITY): Payer: Self-pay

## 2022-11-11 ENCOUNTER — Other Ambulatory Visit (HOSPITAL_COMMUNITY): Payer: Self-pay

## 2022-11-11 MED ORDER — ZEPBOUND 15 MG/0.5ML ~~LOC~~ SOAJ
15.0000 mg | SUBCUTANEOUS | 1 refills | Status: DC
  Filled 2022-11-11 – 2023-05-20 (×3): qty 2, 28d supply, fill #0
  Filled 2023-06-18: qty 2, 28d supply, fill #1

## 2022-11-19 ENCOUNTER — Other Ambulatory Visit (HOSPITAL_COMMUNITY): Payer: Self-pay

## 2022-11-19 ENCOUNTER — Other Ambulatory Visit: Payer: Self-pay

## 2022-11-20 ENCOUNTER — Other Ambulatory Visit: Payer: Self-pay

## 2022-11-20 ENCOUNTER — Other Ambulatory Visit (HOSPITAL_COMMUNITY): Payer: Self-pay

## 2022-11-24 ENCOUNTER — Other Ambulatory Visit (HOSPITAL_COMMUNITY): Payer: Self-pay

## 2022-12-01 ENCOUNTER — Other Ambulatory Visit (HOSPITAL_COMMUNITY): Payer: Self-pay

## 2023-01-08 ENCOUNTER — Other Ambulatory Visit (HOSPITAL_COMMUNITY): Payer: Self-pay

## 2023-01-08 MED ORDER — ZEPBOUND 15 MG/0.5ML ~~LOC~~ SOAJ
15.0000 mg | SUBCUTANEOUS | 1 refills | Status: DC
  Filled 2023-01-08 – 2023-01-11 (×2): qty 2, 28d supply, fill #0
  Filled 2023-02-12: qty 2, 28d supply, fill #1

## 2023-01-11 ENCOUNTER — Other Ambulatory Visit: Payer: Self-pay

## 2023-01-11 ENCOUNTER — Other Ambulatory Visit (HOSPITAL_COMMUNITY): Payer: Self-pay

## 2023-02-11 ENCOUNTER — Other Ambulatory Visit (HOSPITAL_COMMUNITY): Payer: Self-pay

## 2023-02-12 ENCOUNTER — Other Ambulatory Visit (HOSPITAL_COMMUNITY): Payer: Self-pay

## 2023-02-22 ENCOUNTER — Other Ambulatory Visit: Payer: Self-pay

## 2023-02-22 ENCOUNTER — Other Ambulatory Visit (HOSPITAL_COMMUNITY): Payer: Self-pay

## 2023-02-22 MED ORDER — PHENTERMINE HCL 37.5 MG PO TABS
37.5000 mg | ORAL_TABLET | Freq: Every day | ORAL | 2 refills | Status: DC
  Filled 2023-02-22: qty 30, 30d supply, fill #0
  Filled 2023-03-08 – 2023-04-04 (×2): qty 30, 30d supply, fill #1
  Filled 2023-05-05: qty 30, 30d supply, fill #0

## 2023-02-22 MED ORDER — ZEPBOUND 15 MG/0.5ML ~~LOC~~ SOAJ
15.0000 mg | SUBCUTANEOUS | 1 refills | Status: DC
  Filled 2023-07-28 – 2023-08-07 (×2): qty 2, 28d supply, fill #0
  Filled 2023-09-06: qty 2, 28d supply, fill #1
  Filled 2023-09-28: qty 2, 28d supply, fill #2
  Filled 2023-11-02 – 2023-11-27 (×2): qty 2, 28d supply, fill #3

## 2023-02-26 ENCOUNTER — Other Ambulatory Visit (HOSPITAL_COMMUNITY): Payer: Self-pay

## 2023-02-26 ENCOUNTER — Other Ambulatory Visit: Payer: Self-pay

## 2023-02-26 MED ORDER — THYROID 90 MG PO TABS
90.0000 mg | ORAL_TABLET | Freq: Every day | ORAL | 3 refills | Status: DC
Start: 2023-02-26 — End: 2023-12-03
  Filled 2023-02-26 – 2023-06-29 (×2): qty 30, 30d supply, fill #0
  Filled 2023-07-28: qty 30, 30d supply, fill #1
  Filled 2023-09-03: qty 30, 30d supply, fill #2
  Filled 2023-09-28: qty 30, 30d supply, fill #3
  Filled 2023-10-30: qty 30, 30d supply, fill #4
  Filled 2023-11-27: qty 30, 30d supply, fill #5

## 2023-03-08 ENCOUNTER — Other Ambulatory Visit (HOSPITAL_COMMUNITY): Payer: Self-pay

## 2023-03-15 ENCOUNTER — Other Ambulatory Visit (HOSPITAL_COMMUNITY): Payer: Self-pay

## 2023-03-15 MED ORDER — ZEPBOUND 15 MG/0.5ML ~~LOC~~ SOAJ
15.0000 mg | SUBCUTANEOUS | 1 refills | Status: DC
  Filled 2023-03-15: qty 2, 28d supply, fill #0

## 2023-03-16 ENCOUNTER — Other Ambulatory Visit (HOSPITAL_COMMUNITY): Payer: Self-pay

## 2023-03-25 ENCOUNTER — Other Ambulatory Visit (HOSPITAL_COMMUNITY): Payer: Self-pay

## 2023-03-25 MED ORDER — BENZONATATE 200 MG PO CAPS
200.0000 mg | ORAL_CAPSULE | Freq: Three times a day (TID) | ORAL | 0 refills | Status: DC | PRN
Start: 2023-03-25 — End: 2023-12-03
  Filled 2023-03-25: qty 30, 10d supply, fill #0

## 2023-03-25 MED ORDER — THYROID 90 MG PO TABS
90.0000 mg | ORAL_TABLET | Freq: Every day | ORAL | 3 refills | Status: AC
  Filled 2023-03-25 – 2023-12-04 (×4): qty 30, 30d supply, fill #0
  Filled 2023-12-27: qty 30, 30d supply, fill #1
  Filled 2024-01-24: qty 30, 30d supply, fill #2

## 2023-03-25 MED ORDER — ZEPBOUND 15 MG/0.5ML ~~LOC~~ SOAJ
15.0000 mg | SUBCUTANEOUS | 1 refills | Status: DC
  Filled 2023-03-25 – 2023-04-15 (×2): qty 2, 28d supply, fill #0

## 2023-03-26 ENCOUNTER — Other Ambulatory Visit (HOSPITAL_COMMUNITY): Payer: Self-pay

## 2023-04-05 ENCOUNTER — Other Ambulatory Visit (HOSPITAL_COMMUNITY): Payer: Self-pay

## 2023-04-05 ENCOUNTER — Other Ambulatory Visit: Payer: Self-pay

## 2023-04-13 ENCOUNTER — Other Ambulatory Visit (HOSPITAL_BASED_OUTPATIENT_CLINIC_OR_DEPARTMENT_OTHER): Payer: Self-pay

## 2023-04-15 ENCOUNTER — Other Ambulatory Visit (HOSPITAL_BASED_OUTPATIENT_CLINIC_OR_DEPARTMENT_OTHER): Payer: Self-pay

## 2023-04-30 ENCOUNTER — Other Ambulatory Visit (HOSPITAL_BASED_OUTPATIENT_CLINIC_OR_DEPARTMENT_OTHER): Payer: Self-pay

## 2023-04-30 ENCOUNTER — Other Ambulatory Visit (HOSPITAL_COMMUNITY): Payer: Self-pay

## 2023-05-05 ENCOUNTER — Other Ambulatory Visit: Payer: Self-pay

## 2023-05-18 ENCOUNTER — Other Ambulatory Visit (HOSPITAL_BASED_OUTPATIENT_CLINIC_OR_DEPARTMENT_OTHER): Payer: Self-pay

## 2023-05-18 MED ORDER — PEG 3350-KCL-NA BICARB-NACL 420 G PO SOLR
ORAL | 0 refills | Status: DC
  Filled 2023-05-18: qty 4000, 2d supply, fill #0

## 2023-05-18 MED ORDER — DULCOLAX 5 MG PO TBEC
DELAYED_RELEASE_TABLET | ORAL | 0 refills | Status: DC
  Filled 2023-05-18: qty 4, 1d supply, fill #0

## 2023-05-20 ENCOUNTER — Other Ambulatory Visit (HOSPITAL_BASED_OUTPATIENT_CLINIC_OR_DEPARTMENT_OTHER): Payer: Self-pay

## 2023-06-12 ENCOUNTER — Other Ambulatory Visit (HOSPITAL_BASED_OUTPATIENT_CLINIC_OR_DEPARTMENT_OTHER): Payer: Self-pay

## 2023-06-14 ENCOUNTER — Other Ambulatory Visit (HOSPITAL_BASED_OUTPATIENT_CLINIC_OR_DEPARTMENT_OTHER): Payer: Self-pay

## 2023-06-14 MED ORDER — PHENTERMINE HCL 37.5 MG PO TABS
37.5000 mg | ORAL_TABLET | Freq: Every day | ORAL | 2 refills | Status: DC
  Filled 2023-06-14: qty 30, 30d supply, fill #0
  Filled 2023-07-12: qty 30, 30d supply, fill #1
  Filled 2023-08-07 – 2023-08-12 (×2): qty 30, 30d supply, fill #2

## 2023-06-15 ENCOUNTER — Other Ambulatory Visit (HOSPITAL_BASED_OUTPATIENT_CLINIC_OR_DEPARTMENT_OTHER): Payer: Self-pay

## 2023-06-28 ENCOUNTER — Other Ambulatory Visit (HOSPITAL_COMMUNITY): Payer: Self-pay

## 2023-06-28 ENCOUNTER — Other Ambulatory Visit (HOSPITAL_BASED_OUTPATIENT_CLINIC_OR_DEPARTMENT_OTHER): Payer: Self-pay

## 2023-06-28 MED ORDER — TOBRAMYCIN-DEXAMETHASONE 0.3-0.1 % OP SUSP
1.0000 [drp] | Freq: Four times a day (QID) | OPHTHALMIC | 0 refills | Status: DC
  Filled 2023-06-28: qty 5, 25d supply, fill #0

## 2023-07-12 ENCOUNTER — Other Ambulatory Visit (HOSPITAL_BASED_OUTPATIENT_CLINIC_OR_DEPARTMENT_OTHER): Payer: Self-pay

## 2023-07-12 MED ORDER — ZEPBOUND 15 MG/0.5ML ~~LOC~~ SOAJ
15.0000 mg | SUBCUTANEOUS | 2 refills | Status: AC
Start: 2023-07-12 — End: ?
  Filled 2023-07-12: qty 2, 28d supply, fill #0
  Filled 2023-09-06 – 2023-11-02 (×2): qty 2, 28d supply, fill #1
  Filled 2023-12-04 (×2): qty 2, 28d supply, fill #2

## 2023-07-13 ENCOUNTER — Other Ambulatory Visit: Payer: Self-pay

## 2023-07-13 ENCOUNTER — Other Ambulatory Visit (HOSPITAL_BASED_OUTPATIENT_CLINIC_OR_DEPARTMENT_OTHER): Payer: Self-pay

## 2023-07-15 ENCOUNTER — Other Ambulatory Visit (HOSPITAL_BASED_OUTPATIENT_CLINIC_OR_DEPARTMENT_OTHER): Payer: Self-pay

## 2023-07-28 ENCOUNTER — Other Ambulatory Visit: Payer: Self-pay

## 2023-07-28 ENCOUNTER — Other Ambulatory Visit (HOSPITAL_BASED_OUTPATIENT_CLINIC_OR_DEPARTMENT_OTHER): Payer: Self-pay

## 2023-07-31 ENCOUNTER — Other Ambulatory Visit (HOSPITAL_BASED_OUTPATIENT_CLINIC_OR_DEPARTMENT_OTHER): Payer: Self-pay

## 2023-08-07 ENCOUNTER — Other Ambulatory Visit (HOSPITAL_BASED_OUTPATIENT_CLINIC_OR_DEPARTMENT_OTHER): Payer: Self-pay

## 2023-08-13 ENCOUNTER — Other Ambulatory Visit (HOSPITAL_BASED_OUTPATIENT_CLINIC_OR_DEPARTMENT_OTHER): Payer: Self-pay

## 2023-08-13 ENCOUNTER — Other Ambulatory Visit: Payer: Self-pay

## 2023-08-17 NOTE — Progress Notes (Unsigned)
 Ben Jackson D[facility] [address] [place] [address] Phone: [phone]   Assessment and Plan:     There are no diagnoses linked to this encounter.  ***   Pertinent previous records reviewed include ***    Follow Up: ***     Subjective:   I, Naviyah Schaffert, am serving as a Neurosurgeon for Doctor Morene Mace  Chief Complaint: hip pain   HPI:   08/18/2023 Patient is a 63 year old female with hip pain. Patient states   Relevant Historical Information: ***  Additional pertinent review of systems negative.   Current Outpatient Medications:    ARMOUR THYROID  90 MG tablet, Take 1 tablet (90 mg total) by mouth every Monday, Tuesday, Wednesday, Thursday, and Friday., Disp: 90 tablet, Rfl: 1   benzonatate  (TESSALON ) 200 MG capsule, Take 1 capsule (200 mg total) by mouth 3 (three) times daily as needed for 10 days., Disp: 30 capsule, Rfl: 0   bisacodyl  (DULCOLAX) 5 MG EC tablet, Take as directed per provider directions., Disp: 4 tablet, Rfl: 0   Cholecalciferol (VITAMIN D3) 125 MCG (5000 UT) CAPS, Take by mouth., Disp: , Rfl:    DULoxetine  (CYMBALTA ) 30 MG capsule, Take 1 capsule (30 mg total) by mouth daily., Disp: 30 capsule, Rfl: 1   lisdexamfetamine (VYVANSE ) 20 MG capsule, Take 1 capsule (20 mg total) by mouth every morning., Disp: 30 capsule, Rfl: 0   meloxicam  (MOBIC ) 15 MG tablet, Take 1 tablet (15 mg total) by mouth daily for 7 days. Do not take with other NSAID's, Disp: 7 tablet, Rfl: 0   meloxicam  (MOBIC ) 15 MG tablet, Take 1 tablet (15 mg total) by mouth daily as needed., Disp: 30 tablet, Rfl: 0   phentermine  (ADIPEX-P ) 37.5 MG tablet, Take 1 tablet (37.5 mg total) by mouth daily before breakfast., Disp: 90 tablet, Rfl: 0   phentermine  (ADIPEX-P ) 37.5 MG tablet, Take 1 tablet (37.5 mg total) by mouth daily before breakfast., Disp: 90 tablet, Rfl: 0   phentermine  (ADIPEX-P ) 37.5 MG tablet, Take 1 tablet (37.5 mg total) by  mouth daily before breakfast., Disp: 90 tablet, Rfl: 0   phentermine  (ADIPEX-P ) 37.5 MG tablet, Take 1 tablet (37.5 mg total) by mouth in the morning before breakfast, Disp: 90 tablet, Rfl: 1   phentermine  (ADIPEX-P ) 37.5 MG tablet, Take 1 tablet (37.5 mg total) by mouth daily before breakfast., Disp: 90 tablet, Rfl: 1   phentermine  (ADIPEX-P ) 37.5 MG tablet, Take 1 tablet (37.5 mg total) by mouth every morning before breakfast., Disp: 30 tablet, Rfl: 2   phentermine  (ADIPEX-P ) 37.5 MG tablet, Take 1 tablet (37.5 mg total) by mouth daily before breakfast., Disp: 30 tablet, Rfl: 2   polyethylene glycol-electrolytes (NULYTELY) 420 g solution, Use as directed per provider/package directions., Disp: 4000 mL, Rfl: 0   thyroid  (ARMOUR THYROID ) 90 MG tablet, Take 1 tablet (90 mg total) by mouth daily., Disp: 90 tablet, Rfl: 3   thyroid  (ARMOUR THYROID ) 90 MG tablet, Take 1 tablet (90 mg total) by mouth daily., Disp: 90 tablet, Rfl: 3   thyroid  (ARMOUR) 120 MG tablet, Take 1 tablet (120 mg total) by mouth 2 (two) times a week., Disp: 16 tablet, Rfl: 1   thyroid  (ARMOUR) 90 MG tablet, Take 1 tablet (90 mg total) by mouth daily., Disp: 90 tablet, Rfl: 1   tirzepatide  (MOUNJARO ) 10 MG/0.5ML Pen, Inject 10 mg into the skin once a week., Disp: 6 mL, Rfl: 0   tirzepatide  (MOUNJARO ) 12.5  MG/0.5ML Pen, Inject 12.5 mg into the skin once a week., Disp: 2 mL, Rfl: 1   tirzepatide  (MOUNJARO ) 12.5 MG/0.5ML Pen, Inject 12.5 mg into the skin once a week., Disp: 2 mL, Rfl: 1   tirzepatide  (MOUNJARO ) 15 MG/0.5ML Pen, Inject 15 mg into the skin once a week., Disp: 6 mL, Rfl: 3   tirzepatide  (MOUNJARO ) 15 MG/0.5ML Pen, Inject 15 mg into the skin once a week., Disp: 2 mL, Rfl: 1   tirzepatide  (MOUNJARO ) 15 MG/0.5ML Pen, Inject 15 mg into the skin every 7 (seven) days., Disp: 2 mL, Rfl: 1   tirzepatide  (MOUNJARO ) 15 MG/0.5ML Pen, Inject 15 mg into the skin once a week., Disp: 6 mL, Rfl: 1   tirzepatide  (MOUNJARO ) 15 MG/0.5ML  Pen, Inject 15 mg into the skin once a week., Disp: 2 mL, Rfl: 1   tirzepatide  (MOUNJARO ) 7.5 MG/0.5ML Pen, Inject 7.5 mg into the skin once a week., Disp: 2 mL, Rfl: 2   tirzepatide  (ZEPBOUND ) 15 MG/0.5ML Pen, Inject 15 mg into the skin once a week., Disp: 2 mL, Rfl: 1   tirzepatide  (ZEPBOUND ) 15 MG/0.5ML Pen, Inject 15 mg into the skin once a week., Disp: 6 mL, Rfl: 1   tirzepatide  (ZEPBOUND ) 15 MG/0.5ML Pen, Inject 15 mg into the skin once a week., Disp: 2 mL, Rfl: 1   tirzepatide  (ZEPBOUND ) 15 MG/0.5ML Pen, Inject 15 mg into the skin once a week., Disp: 2 mL, Rfl: 1   tirzepatide  (ZEPBOUND ) 15 MG/0.5ML Pen, Inject 15 mg into the skin every 7 (seven) days., Disp: 2 mL, Rfl: 2   tobramycin -dexamethasone  (TOBRADEX ) ophthalmic solution, Place 1 drop into the left eye 4 (four) times daily., Disp: 5 mL, Rfl: 0   Objective:     There were no vitals filed for this visit.    There is no height or weight on file to calculate BMI.    Physical Exam:    ***   Electronically signed by:  Odis Mace D[facility] 11:39 AM 08/17/23

## 2023-08-18 ENCOUNTER — Ambulatory Visit (INDEPENDENT_AMBULATORY_CARE_PROVIDER_SITE_OTHER): Payer: Self-pay | Admitting: Sports Medicine

## 2023-08-18 ENCOUNTER — Other Ambulatory Visit (HOSPITAL_BASED_OUTPATIENT_CLINIC_OR_DEPARTMENT_OTHER): Payer: Self-pay

## 2023-08-18 VITALS — BP 110/82 | HR 44 | Ht 64.0 in | Wt 201.0 lb

## 2023-08-18 DIAGNOSIS — M25552 Pain in left hip: Secondary | ICD-10-CM | POA: Diagnosis not present

## 2023-08-18 DIAGNOSIS — M2550 Pain in unspecified joint: Secondary | ICD-10-CM | POA: Diagnosis not present

## 2023-08-18 DIAGNOSIS — M1612 Unilateral primary osteoarthritis, left hip: Secondary | ICD-10-CM

## 2023-08-18 LAB — COMPREHENSIVE METABOLIC PANEL WITH GFR
ALT: 16 U/L (ref 0–35)
AST: 13 U/L (ref 0–37)
Albumin: 4 g/dL (ref 3.5–5.2)
Alkaline Phosphatase: 76 U/L (ref 39–117)
BUN: 17 mg/dL (ref 6–23)
CO2: 26 meq/L (ref 19–32)
Calcium: 9.2 mg/dL (ref 8.4–10.5)
Chloride: 105 meq/L (ref 96–112)
Creatinine, Ser: 0.76 mg/dL (ref 0.40–1.20)
GFR: 83.06 mL/min (ref >60.00)
Glucose, Bld: 87 mg/dL (ref 70–99)
Potassium: 4.4 meq/L (ref 3.5–5.1)
Sodium: 138 meq/L (ref 135–145)
Total Bilirubin: 0.4 mg/dL (ref 0.2–1.2)
Total Protein: 7 g/dL (ref 6.0–8.3)

## 2023-08-18 LAB — CBC WITH DIFFERENTIAL/PLATELET
Basophils Absolute: 0.1 K/uL (ref 0.0–0.1)
Basophils Relative: 1.4 % (ref 0.0–3.0)
Eosinophils Absolute: 0.2 K/uL (ref 0.0–0.7)
Eosinophils Relative: 2.8 % (ref 0.0–5.0)
HCT: 41.9 % (ref 36.0–46.0)
Hemoglobin: 13.4 g/dL (ref 12.0–15.0)
Lymphocytes Relative: 34.1 % (ref 12.0–46.0)
Lymphs Abs: 2.6 K/uL (ref 0.7–4.0)
MCHC: 32 g/dL (ref 30.0–36.0)
MCV: 81.5 fl (ref 78.0–100.0)
Monocytes Absolute: 0.5 K/uL (ref 0.1–1.0)
Monocytes Relative: 6.9 % (ref 3.0–12.0)
Neutro Abs: 4.2 K/uL (ref 1.4–7.7)
Neutrophils Relative %: 54.8 % (ref 43.0–77.0)
Platelets: 423 K/uL — ABNORMAL HIGH (ref 150.0–400.0)
RBC: 5.15 Mil/uL — ABNORMAL HIGH (ref 3.87–5.11)
RDW: 14.4 % (ref 11.5–15.5)
WBC: 7.7 K/uL (ref 4.0–10.5)

## 2023-08-18 LAB — TSH: TSH: 1.77 u[IU]/mL (ref 0.35–5.50)

## 2023-08-18 LAB — URIC ACID: Uric Acid, Serum: 5.4 mg/dL (ref 2.4–7.0)

## 2023-08-18 LAB — FERRITIN: Ferritin: 73.5 ng/mL (ref 10.0–291.0)

## 2023-08-18 LAB — VITAMIN D 25 HYDROXY (VIT D DEFICIENCY, FRACTURES): VITD: 27.74 ng/mL — ABNORMAL LOW (ref 30.00–100.00)

## 2023-08-18 LAB — SEDIMENTATION RATE: Sed Rate: 44 mm/h — ABNORMAL HIGH (ref 0–30)

## 2023-08-18 LAB — C-REACTIVE PROTEIN: CRP: <1 mg/dL (ref 0.5–20.0)

## 2023-08-18 MED ORDER — MELOXICAM 15 MG PO TABS
15.0000 mg | ORAL_TABLET | Freq: Every day | ORAL | 0 refills | Status: DC
  Filled 2023-08-18: qty 30, 30d supply, fill #0

## 2023-08-18 NOTE — Patient Instructions (Signed)
-   Start meloxicam  15 mg daily x2 weeks.  If still having pain after 2 weeks, complete 3rd-week of NSAID. May use remaining NSAID as needed once daily for pain control.  Do not to use additional over-the-counter NSAIDs (ibuprofen, naproxen, Advil, Aleve, etc.) while taking prescription NSAIDs.  May use Tylenol [phone] mg 2 to 3 times a day for breakthrough pain.  Hip HEP  Recommend PT for the same   Labs on the way out   4 week follow up

## 2023-08-20 LAB — ANA+ENA+DNA/DS+SCL 70+SJOSSA/B
ANA Titer 1: POSITIVE — AB
ENA RNP Ab: <0.2 AI (ref 0.0–0.9)
ENA SM Ab Ser-aCnc: <0.2 AI (ref 0.0–0.9)
ENA SSA (RO) Ab: 0.4 AI (ref 0.0–0.9)
ENA SSB (LA) Ab: <0.2 AI (ref 0.0–0.9)
Scleroderma (Scl-70) (ENA) Antibody, IgG: <0.2 AI (ref 0.0–0.9)
dsDNA Ab: 1 [IU]/mL (ref 0–9)

## 2023-08-20 LAB — FANA STAINING PATTERNS: Speckled Pattern: 1:1280 {titer} — ABNORMAL HIGH

## 2023-08-21 LAB — RHEUMATOID FACTOR: Rheumatoid fact SerPl-aCnc: <10 [IU]/mL (ref <14)

## 2023-08-21 LAB — CYCLIC CITRUL PEPTIDE ANTIBODY, IGG: Cyclic Citrullin Peptide Ab: <16 U

## 2023-08-23 ENCOUNTER — Ambulatory Visit: Payer: Self-pay | Admitting: Sports Medicine

## 2023-08-23 ENCOUNTER — Other Ambulatory Visit: Payer: Self-pay | Admitting: Sports Medicine

## 2023-08-23 DIAGNOSIS — R768 Other specified abnormal immunological findings in serum: Secondary | ICD-10-CM

## 2023-08-23 DIAGNOSIS — M2550 Pain in unspecified joint: Secondary | ICD-10-CM

## 2023-08-23 NOTE — Progress Notes (Signed)
 Referral has been placed.

## 2023-09-03 ENCOUNTER — Other Ambulatory Visit (HOSPITAL_COMMUNITY): Payer: Self-pay

## 2023-09-03 ENCOUNTER — Other Ambulatory Visit (HOSPITAL_BASED_OUTPATIENT_CLINIC_OR_DEPARTMENT_OTHER): Payer: Self-pay

## 2023-09-06 ENCOUNTER — Other Ambulatory Visit (HOSPITAL_BASED_OUTPATIENT_CLINIC_OR_DEPARTMENT_OTHER): Payer: Self-pay

## 2023-09-07 ENCOUNTER — Other Ambulatory Visit: Payer: Self-pay

## 2023-09-10 ENCOUNTER — Other Ambulatory Visit (HOSPITAL_BASED_OUTPATIENT_CLINIC_OR_DEPARTMENT_OTHER): Payer: Self-pay

## 2023-09-14 NOTE — Progress Notes (Unsigned)
 Ben Jackson D[facility] [address] [place] [address] Phone: [phone]   Assessment and Plan:     1. Primary osteoarthritis of left hip (Primary) 2. Left hip pain -Chronic with exacerbation, subsequent visit - Overall significant improvement in symptoms after completing meloxicam  course - Recommend restarting HEP for injury prevention - Use Tylenol 500 to 1000 mg tablets 2-3 times a day for day-to-day pain relief - Use meloxicam  15 mg daily as needed for pain.  Recommend limiting chronic NSAIDs to 1-2 doses per week to prevent long-term side effects.   3. Positive ANA (antinuclear antibody) 4. Polyarthralgia -Chronic with exacerbation, subsequent visit - New finding of positive ANA with titer 1: 1280, elevated sed rate at 44, and unremarkable antibody testing.  Overall decrease in multiple areas of musculoskeletal pain with meloxicam  course. - Recommend establishing care with rheumatology.  Referral placed previously.  Provided patient with phone number to contact their office -May continue weight loss journey, healthy/anti-inflammatory diet, anti-inflammatory supplements such as turmeric and tart cherry extract    Pertinent previous records reviewed include lab work 08/18/2023   Follow Up: 6 weeks for reevaluation to ensure conservative treatment plan is beneficial for patient   Subjective:   I, Claretha Schimke am a scribe for Dr. Leonce.    Chief Complaint: L hip pain    HPI:    08/18/2023 Patient is a 63 year old female with hip pain. Patient states left hip pain for a couple of years. No MOI. Decreased ROM. Is in PT. Pain radiates down her legs. Pain when walking, sleeping and sitting. Deep water walking helps with the pain. Pain is intermittent. Intermittent tylenol and naproxen for the pain.     09/15/2023 Patient states feeling pretty good today. The meloxicam  has really made a difference. She feels way better.    Relevant  Historical Information: GERD    Additional pertinent review of systems negative.   Current Outpatient Medications:    ARMOUR THYROID  90 MG tablet, Take 1 tablet (90 mg total) by mouth every Monday, Tuesday, Wednesday, Thursday, and Friday., Disp: 90 tablet, Rfl: 1   benzonatate  (TESSALON ) 200 MG capsule, Take 1 capsule (200 mg total) by mouth 3 (three) times daily as needed for 10 days., Disp: 30 capsule, Rfl: 0   Cholecalciferol (VITAMIN D3) 125 MCG (5000 UT) CAPS, Take by mouth., Disp: , Rfl:    DULoxetine  (CYMBALTA ) 30 MG capsule, Take 1 capsule (30 mg total) by mouth daily., Disp: 30 capsule, Rfl: 1   lisdexamfetamine (VYVANSE ) 20 MG capsule, Take 1 capsule (20 mg total) by mouth every morning., Disp: 30 capsule, Rfl: 0   meloxicam  (MOBIC ) 15 MG tablet, Take 1 tablet (15 mg total) by mouth daily., Disp: 30 tablet, Rfl: 0   phentermine  (ADIPEX-P ) 37.5 MG tablet, Take 1 tablet (37.5 mg total) by mouth daily before breakfast., Disp: 90 tablet, Rfl: 0   phentermine  (ADIPEX-P ) 37.5 MG tablet, Take 1 tablet (37.5 mg total) by mouth daily before breakfast., Disp: 90 tablet, Rfl: 0   phentermine  (ADIPEX-P ) 37.5 MG tablet, Take 1 tablet (37.5 mg total) by mouth daily before breakfast., Disp: 90 tablet, Rfl: 0   phentermine  (ADIPEX-P ) 37.5 MG tablet, Take 1 tablet (37.5 mg total) by mouth in the morning before breakfast, Disp: 90 tablet, Rfl: 1   phentermine  (ADIPEX-P ) 37.5 MG tablet, Take 1 tablet (37.5 mg total) by mouth daily before breakfast., Disp: 90 tablet, Rfl: 1   phentermine  (ADIPEX-P ) 37.5 MG  tablet, Take 1 tablet (37.5 mg total) by mouth every morning before breakfast., Disp: 30 tablet, Rfl: 2   phentermine  (ADIPEX-P ) 37.5 MG tablet, Take 1 tablet (37.5 mg total) by mouth daily before breakfast., Disp: 30 tablet, Rfl: 2   thyroid  (ARMOUR THYROID ) 90 MG tablet, Take 1 tablet (90 mg total) by mouth daily., Disp: 90 tablet, Rfl: 3   thyroid  (ARMOUR THYROID ) 90 MG tablet, Take 1 tablet (90 mg  total) by mouth daily., Disp: 90 tablet, Rfl: 3   thyroid  (ARMOUR) 120 MG tablet, Take 1 tablet (120 mg total) by mouth 2 (two) times a week., Disp: 16 tablet, Rfl: 1   thyroid  (ARMOUR) 90 MG tablet, Take 1 tablet (90 mg total) by mouth daily., Disp: 90 tablet, Rfl: 1   tirzepatide  (MOUNJARO ) 10 MG/0.5ML Pen, Inject 10 mg into the skin once a week., Disp: 6 mL, Rfl: 0   tirzepatide  (MOUNJARO ) 12.5 MG/0.5ML Pen, Inject 12.5 mg into the skin once a week., Disp: 2 mL, Rfl: 1   tirzepatide  (MOUNJARO ) 12.5 MG/0.5ML Pen, Inject 12.5 mg into the skin once a week., Disp: 2 mL, Rfl: 1   tirzepatide  (MOUNJARO ) 15 MG/0.5ML Pen, Inject 15 mg into the skin once a week., Disp: 6 mL, Rfl: 3   tirzepatide  (MOUNJARO ) 15 MG/0.5ML Pen, Inject 15 mg into the skin once a week., Disp: 2 mL, Rfl: 1   tirzepatide  (MOUNJARO ) 15 MG/0.5ML Pen, Inject 15 mg into the skin every 7 (seven) days., Disp: 2 mL, Rfl: 1   tirzepatide  (MOUNJARO ) 15 MG/0.5ML Pen, Inject 15 mg into the skin once a week., Disp: 6 mL, Rfl: 1   tirzepatide  (MOUNJARO ) 15 MG/0.5ML Pen, Inject 15 mg into the skin once a week., Disp: 2 mL, Rfl: 1   tirzepatide  (ZEPBOUND ) 15 MG/0.5ML Pen, Inject 15 mg into the skin once a week., Disp: 2 mL, Rfl: 1   tirzepatide  (ZEPBOUND ) 15 MG/0.5ML Pen, Inject 15 mg into the skin once a week., Disp: 6 mL, Rfl: 1   tirzepatide  (ZEPBOUND ) 15 MG/0.5ML Pen, Inject 15 mg into the skin once a week., Disp: 2 mL, Rfl: 1   tirzepatide  (ZEPBOUND ) 15 MG/0.5ML Pen, Inject 15 mg into the skin once a week., Disp: 2 mL, Rfl: 1   tirzepatide  (ZEPBOUND ) 15 MG/0.5ML Pen, Inject 15 mg into the skin every 7 (seven) days., Disp: 2 mL, Rfl: 2   tobramycin -dexamethasone  (TOBRADEX ) ophthalmic solution, Place 1 drop into the left eye 4 (four) times daily., Disp: 5 mL, Rfl: 0   meloxicam  (MOBIC ) 15 MG tablet, Take 1 tablet (15 mg total) by mouth as needed., Disp: 30 tablet, Rfl: 0   Objective:     Vitals:   09/15/23 0826  BP: 110/70  Pulse: 65   SpO2: 98%  Height: 5' 4 (1.626 m)      Body mass index is 34.5 kg/m.    Physical Exam:    General: awake, alert, and oriented no acute distress, nontoxic Skin: no suspicious lesions or rashes Neuro:sensation intact distally with no deficits, normal muscle tone, no atrophy, strength 5/5 in all tested lower ext groups Psych: normal mood and affect, speech clear   Left hip: No deformity, swelling or wasting ROM Flexion 90, ext 30, IR 45, ER 45 TTP mildly over the hip flexors, greater trochanter, gluteal musculature, NTTP si joint, lumbar spine Negative log roll with FROM Negative FABER Negative FADIR Negative Piriformis test Negative trendelenberg Gait normal    Electronically signed by:  Odis Mace D.[facility] 9:02 AM 09/15/23

## 2023-09-15 ENCOUNTER — Other Ambulatory Visit (INDEPENDENT_AMBULATORY_CARE_PROVIDER_SITE_OTHER): Payer: Self-pay

## 2023-09-15 ENCOUNTER — Ambulatory Visit (INDEPENDENT_AMBULATORY_CARE_PROVIDER_SITE_OTHER): Admitting: Sports Medicine

## 2023-09-15 ENCOUNTER — Other Ambulatory Visit (HOSPITAL_BASED_OUTPATIENT_CLINIC_OR_DEPARTMENT_OTHER): Payer: Self-pay

## 2023-09-15 ENCOUNTER — Other Ambulatory Visit (HOSPITAL_COMMUNITY): Payer: Self-pay

## 2023-09-15 VITALS — BP 110/70 | HR 65 | Ht 64.0 in

## 2023-09-15 DIAGNOSIS — M2550 Pain in unspecified joint: Secondary | ICD-10-CM

## 2023-09-15 DIAGNOSIS — R768 Other specified abnormal immunological findings in serum: Secondary | ICD-10-CM | POA: Diagnosis not present

## 2023-09-15 DIAGNOSIS — M1612 Unilateral primary osteoarthritis, left hip: Secondary | ICD-10-CM | POA: Diagnosis not present

## 2023-09-15 DIAGNOSIS — M25552 Pain in left hip: Secondary | ICD-10-CM | POA: Diagnosis not present

## 2023-09-15 MED ORDER — MELOXICAM 15 MG PO TABS
15.0000 mg | ORAL_TABLET | ORAL | 0 refills | Status: DC | PRN
  Filled 2023-09-15: qty 30, 30d supply, fill #0

## 2023-09-15 MED ORDER — MELOXICAM 15 MG PO TABS
15.0000 mg | ORAL_TABLET | ORAL | 0 refills | Status: AC | PRN
  Filled 2023-09-15 – 2023-09-28 (×2): qty 30, 30d supply, fill #0

## 2023-09-15 NOTE — Patient Instructions (Addendum)
 Restart HEP program. Reach out to Physical therapy friend if you want to start. Call [facility] and establish with them at [phone]. - Use Tylenol 500 to 1000 mg tablets 2-3 times a day for day-to-day pain relief - Use meloxicam  15 mg daily as needed for pain.  Recommend limiting chronic NSAIDs to 1-2 doses per week to prevent long-term side effects. Follow up in 6 weeks.

## 2023-09-15 NOTE — Addendum Note (Signed)
 Addended by: TONNIE SHU D on: 09/15/2023 09:16 AM   Modules accepted: Orders

## 2023-09-21 ENCOUNTER — Other Ambulatory Visit: Payer: Self-pay | Admitting: Sports Medicine

## 2023-09-21 ENCOUNTER — Encounter: Payer: Self-pay | Admitting: Sports Medicine

## 2023-09-21 DIAGNOSIS — M2550 Pain in unspecified joint: Secondary | ICD-10-CM

## 2023-09-21 DIAGNOSIS — R768 Other specified abnormal immunological findings in serum: Secondary | ICD-10-CM

## 2023-09-21 NOTE — Progress Notes (Unsigned)
 Referral placed.

## 2023-09-28 ENCOUNTER — Other Ambulatory Visit (HOSPITAL_BASED_OUTPATIENT_CLINIC_OR_DEPARTMENT_OTHER): Payer: Self-pay

## 2023-09-29 ENCOUNTER — Encounter: Payer: Self-pay | Admitting: Sports Medicine

## 2023-10-12 ENCOUNTER — Other Ambulatory Visit (HOSPITAL_BASED_OUTPATIENT_CLINIC_OR_DEPARTMENT_OTHER): Payer: Self-pay

## 2023-10-12 MED ORDER — MELOXICAM 15 MG PO TABS
15.0000 mg | ORAL_TABLET | Freq: Every day | ORAL | 1 refills | Status: DC
  Filled 2023-10-12: qty 90, 90d supply, fill #0
  Filled 2023-11-02: qty 30, 30d supply, fill #0

## 2023-10-26 NOTE — Progress Notes (Unsigned)
 Ben Jackson D.[facility] [address] [place] [address] Phone: [phone]   Assessment and Plan:     1. Primary osteoarthritis of left hip (Primary) 2. Left hip pain 3. Positive ANA (antinuclear antibody) 4. Polyarthralgia -Chronic with exacerbation, subsequent visit - Recurrent pain in the left hip, the patient now experiencing pain in bilateral hips, bilateral lower extremities.  Patient's pain may be from flare of mild osteoarthritis with surrounding muscular compensatory pain or it is possible that a rheumatologic condition could be contributing. - Recommend continuing HEP and increasing physical activity such as stationary bike, elliptical swimming - Use meloxicam  15 mg daily as needed for breakthrough pain.  Recommend limiting chronic NSAIDs to 1-2 doses per week to prevent long-term side effects. Use Tylenol 500 to 1000 mg tablets 2-3 times a day as needed for day-to-day pain relief.    - I will provide patient with a prednisone Dosepak that she can use as needed for severe musculoskeletal pain flares -Patient established with [facility] rheumatology with new finding of positive ANA with titer 1: 1280, elevated sed rate 44, with unremarkable antibody testing.  Patient states she did not have a great experience and we discussed a second opinion could be beneficial.  Patient will establish with [facility] rheumatology - Encouraged to continue weight loss journey, anti-inflammatory diet  Pertinent previous records reviewed include none   Follow Up: As needed if no improvement or worsening of symptoms.  Could consider intra-articular CSI   Subjective:   I, Ojani Berenson, am serving as a Neurosurgeon for Doctor Morene Mace  Chief Complaint: L hip pain    HPI:    08/18/2023 Patient is a 63 year old female with hip pain. Patient states left hip pain for a couple of years. No MOI. Decreased ROM. Is in PT. Pain radiates down her legs. Pain when  walking, sleeping and sitting. Deep water walking helps with the pain. Pain is intermittent. Intermittent tylenol and naproxen for the pain.     09/15/2023 Patient states feeling pretty good today. The meloxicam  has really made a difference. She feels way better.   10/27/2023 Patient states she is not doing well. She did not like rheumatologist. She has had a lot of pain since yesterday. She doesn't want to live like this the rest of her life     Relevant Historical Information: GERD  Additional pertinent review of systems negative.   Current Outpatient Medications:    methylPREDNISolone (MEDROL DOSEPAK) 4 MG TBPK tablet, Take 6 tablets on day 1.  Take 5 tablets on day 2.  Take 4 tablets on day 3.  Take 3 tablets on day 4.  Take 2 tablets on day 5.  Take 1 tablet on day 6., Disp: 21 tablet, Rfl: 1   ARMOUR THYROID  90 MG tablet, Take 1 tablet (90 mg total) by mouth every Monday, Tuesday, Wednesday, Thursday, and Friday., Disp: 90 tablet, Rfl: 1   benzonatate  (TESSALON ) 200 MG capsule, Take 1 capsule (200 mg total) by mouth 3 (three) times daily as needed for 10 days., Disp: 30 capsule, Rfl: 0   Cholecalciferol (VITAMIN D3) 125 MCG (5000 UT) CAPS, Take by mouth., Disp: , Rfl:    DULoxetine  (CYMBALTA ) 30 MG capsule, Take 1 capsule (30 mg total) by mouth daily., Disp: 30 capsule, Rfl: 1   lisdexamfetamine (VYVANSE ) 20 MG capsule, Take 1 capsule (20 mg total) by mouth every morning., Disp: 30 capsule, Rfl: 0   meloxicam  (MOBIC ) 15 MG  tablet, Take 1 tablet (15 mg total) by mouth daily., Disp: 30 tablet, Rfl: 0   meloxicam  (MOBIC ) 15 MG tablet, Take 1 tablet (15 mg total) by mouth daily as needed., Disp: 30 tablet, Rfl: 0   meloxicam  (MOBIC ) 15 MG tablet, Take 1 tablet (15 mg total) by mouth daily., Disp: 90 tablet, Rfl: 1   phentermine  (ADIPEX-P ) 37.5 MG tablet, Take 1 tablet (37.5 mg total) by mouth daily before breakfast., Disp: 90 tablet, Rfl: 0   phentermine  (ADIPEX-P ) 37.5 MG tablet, Take 1  tablet (37.5 mg total) by mouth daily before breakfast., Disp: 90 tablet, Rfl: 0   phentermine  (ADIPEX-P ) 37.5 MG tablet, Take 1 tablet (37.5 mg total) by mouth daily before breakfast., Disp: 90 tablet, Rfl: 0   phentermine  (ADIPEX-P ) 37.5 MG tablet, Take 1 tablet (37.5 mg total) by mouth in the morning before breakfast, Disp: 90 tablet, Rfl: 1   phentermine  (ADIPEX-P ) 37.5 MG tablet, Take 1 tablet (37.5 mg total) by mouth daily before breakfast., Disp: 90 tablet, Rfl: 1   phentermine  (ADIPEX-P ) 37.5 MG tablet, Take 1 tablet (37.5 mg total) by mouth every morning before breakfast., Disp: 30 tablet, Rfl: 2   phentermine  (ADIPEX-P ) 37.5 MG tablet, Take 1 tablet (37.5 mg total) by mouth daily before breakfast., Disp: 30 tablet, Rfl: 2   thyroid  (ARMOUR THYROID ) 90 MG tablet, Take 1 tablet (90 mg total) by mouth daily., Disp: 90 tablet, Rfl: 3   thyroid  (ARMOUR THYROID ) 90 MG tablet, Take 1 tablet (90 mg total) by mouth daily., Disp: 90 tablet, Rfl: 3   thyroid  (ARMOUR) 120 MG tablet, Take 1 tablet (120 mg total) by mouth 2 (two) times a week., Disp: 16 tablet, Rfl: 1   thyroid  (ARMOUR) 90 MG tablet, Take 1 tablet (90 mg total) by mouth daily., Disp: 90 tablet, Rfl: 1   tirzepatide  (MOUNJARO ) 10 MG/0.5ML Pen, Inject 10 mg into the skin once a week., Disp: 6 mL, Rfl: 0   tirzepatide  (MOUNJARO ) 12.5 MG/0.5ML Pen, Inject 12.5 mg into the skin once a week., Disp: 2 mL, Rfl: 1   tirzepatide  (MOUNJARO ) 12.5 MG/0.5ML Pen, Inject 12.5 mg into the skin once a week., Disp: 2 mL, Rfl: 1   tirzepatide  (MOUNJARO ) 15 MG/0.5ML Pen, Inject 15 mg into the skin once a week., Disp: 6 mL, Rfl: 3   tirzepatide  (MOUNJARO ) 15 MG/0.5ML Pen, Inject 15 mg into the skin once a week., Disp: 2 mL, Rfl: 1   tirzepatide  (MOUNJARO ) 15 MG/0.5ML Pen, Inject 15 mg into the skin every 7 (seven) days., Disp: 2 mL, Rfl: 1   tirzepatide  (MOUNJARO ) 15 MG/0.5ML Pen, Inject 15 mg into the skin once a week., Disp: 6 mL, Rfl: 1   tirzepatide   (MOUNJARO ) 15 MG/0.5ML Pen, Inject 15 mg into the skin once a week., Disp: 2 mL, Rfl: 1   tirzepatide  (ZEPBOUND ) 15 MG/0.5ML Pen, Inject 15 mg into the skin once a week., Disp: 2 mL, Rfl: 1   tirzepatide  (ZEPBOUND ) 15 MG/0.5ML Pen, Inject 15 mg into the skin once a week., Disp: 6 mL, Rfl: 1   tirzepatide  (ZEPBOUND ) 15 MG/0.5ML Pen, Inject 15 mg into the skin once a week., Disp: 2 mL, Rfl: 1   tirzepatide  (ZEPBOUND ) 15 MG/0.5ML Pen, Inject 15 mg into the skin once a week., Disp: 2 mL, Rfl: 1   tirzepatide  (ZEPBOUND ) 15 MG/0.5ML Pen, Inject 15 mg into the skin every 7 (seven) days., Disp: 2 mL, Rfl: 2   tobramycin -dexamethasone  (TOBRADEX ) ophthalmic solution, Place 1 drop into  the left eye 4 (four) times daily., Disp: 5 mL, Rfl: 0   Objective:     Vitals:   10/27/23 0847  BP: 130/82  Pulse: 84  SpO2: 98%  Weight: 201 lb (91.2 kg)  Height: 5' 4 (1.626 m)      Body mass index is 34.5 kg/m.    Physical Exam:    General: awake, alert, and oriented no acute distress, nontoxic Skin: no suspicious lesions or rashes Neuro:sensation intact distally with no deficits, normal muscle tone, no atrophy, strength 5/5 in all tested lower ext groups Psych: normal mood and affect, speech clear   Bilateral hip: No deformity, swelling or wasting ROM Flexion 90, ext 30, IR 45, ER 45  TTP over the hip flexors, greater trochanter, gluteal musculature, NTTP si joint, lumbar spine Negative log roll with FROM Positive FABER for posterior hip pain Positive FADIR for posterior hip pain Negative Piriformis test for radicular symptoms, though reproduced posterior and lateral hip pain Positive left trendelenberg Gait normal     Electronically signed by:  Odis Mace D.[facility] 9:26 AM 10/27/23

## 2023-10-27 ENCOUNTER — Other Ambulatory Visit (HOSPITAL_BASED_OUTPATIENT_CLINIC_OR_DEPARTMENT_OTHER): Payer: Self-pay

## 2023-10-27 ENCOUNTER — Ambulatory Visit: Admitting: Sports Medicine

## 2023-10-27 VITALS — BP 130/82 | HR 84 | Ht 64.0 in | Wt 201.0 lb

## 2023-10-27 DIAGNOSIS — M25552 Pain in left hip: Secondary | ICD-10-CM

## 2023-10-27 DIAGNOSIS — R7689 Other specified abnormal immunological findings in serum: Secondary | ICD-10-CM

## 2023-10-27 DIAGNOSIS — M2550 Pain in unspecified joint: Secondary | ICD-10-CM

## 2023-10-27 DIAGNOSIS — M1612 Unilateral primary osteoarthritis, left hip: Secondary | ICD-10-CM | POA: Diagnosis not present

## 2023-10-27 MED ORDER — METHYLPREDNISOLONE 4 MG PO TBPK
ORAL_TABLET | ORAL | 1 refills | Status: DC
  Filled 2023-10-27: qty 21, 6d supply, fill #0
  Filled 2023-11-02: qty 21, 6d supply, fill #1

## 2023-10-27 NOTE — Patient Instructions (Signed)
 Recommend reach back out to Dr. Dolphus if another referral is needed call us  and ask.   Prednisone dos pak  - Use meloxicam  15 mg daily as needed for breakthrough pain.  Recommend limiting chronic NSAIDs to 1-2 doses per week to prevent long-term side effects. Use Tylenol 500 to 1000 mg tablets 2-3 times a day as needed for day-to-day pain relief.    Restart water aerobics, elliptical, stationary bike  As needed follow up, if you are interested in injections

## 2023-10-30 ENCOUNTER — Other Ambulatory Visit (HOSPITAL_BASED_OUTPATIENT_CLINIC_OR_DEPARTMENT_OTHER): Payer: Self-pay

## 2023-10-30 MED ORDER — COMIRNATY 30 MCG/0.3ML IM SUSY
0.3000 mL | PREFILLED_SYRINGE | Freq: Once | INTRAMUSCULAR | 0 refills | Status: AC
  Filled 2023-10-30: qty 0.3, 1d supply, fill #0

## 2023-11-02 ENCOUNTER — Other Ambulatory Visit: Payer: Self-pay

## 2023-11-02 ENCOUNTER — Other Ambulatory Visit (HOSPITAL_BASED_OUTPATIENT_CLINIC_OR_DEPARTMENT_OTHER): Payer: Self-pay

## 2023-11-10 ENCOUNTER — Other Ambulatory Visit: Payer: Self-pay

## 2023-11-10 ENCOUNTER — Other Ambulatory Visit (HOSPITAL_BASED_OUTPATIENT_CLINIC_OR_DEPARTMENT_OTHER): Payer: Self-pay

## 2023-11-27 ENCOUNTER — Other Ambulatory Visit (HOSPITAL_BASED_OUTPATIENT_CLINIC_OR_DEPARTMENT_OTHER): Payer: Self-pay

## 2023-12-03 ENCOUNTER — Ambulatory Visit: Admitting: Family Medicine

## 2023-12-03 ENCOUNTER — Other Ambulatory Visit (HOSPITAL_BASED_OUTPATIENT_CLINIC_OR_DEPARTMENT_OTHER): Payer: Self-pay

## 2023-12-03 VITALS — BP 110/62 | HR 52 | Ht 65.0 in | Wt 205.6 lb

## 2023-12-03 DIAGNOSIS — Z23 Encounter for immunization: Secondary | ICD-10-CM | POA: Diagnosis not present

## 2023-12-03 DIAGNOSIS — E039 Hypothyroidism, unspecified: Secondary | ICD-10-CM | POA: Diagnosis not present

## 2023-12-03 DIAGNOSIS — Z780 Asymptomatic menopausal state: Secondary | ICD-10-CM

## 2023-12-03 DIAGNOSIS — M2550 Pain in unspecified joint: Secondary | ICD-10-CM

## 2023-12-03 DIAGNOSIS — Z6834 Body mass index (BMI) 34.0-34.9, adult: Secondary | ICD-10-CM

## 2023-12-03 DIAGNOSIS — R7689 Other specified abnormal immunological findings in serum: Secondary | ICD-10-CM

## 2023-12-03 DIAGNOSIS — N951 Menopausal and female climacteric states: Secondary | ICD-10-CM | POA: Diagnosis not present

## 2023-12-03 DIAGNOSIS — M1612 Unilateral primary osteoarthritis, left hip: Secondary | ICD-10-CM

## 2023-12-03 DIAGNOSIS — R7303 Prediabetes: Secondary | ICD-10-CM

## 2023-12-03 DIAGNOSIS — E66811 Obesity, class 1: Secondary | ICD-10-CM

## 2023-12-03 DIAGNOSIS — E559 Vitamin D deficiency, unspecified: Secondary | ICD-10-CM

## 2023-12-03 LAB — CBC WITH DIFFERENTIAL/PLATELET
Basophils Absolute: 0.1 K/uL (ref 0.0–0.1)
Basophils Relative: 1.9 % (ref 0.0–3.0)
Eosinophils Absolute: 0.2 K/uL (ref 0.0–0.7)
Eosinophils Relative: 3 % (ref 0.0–5.0)
HCT: 40.1 % (ref 36.0–46.0)
Hemoglobin: 13 g/dL (ref 12.0–15.0)
Lymphocytes Relative: 37.9 % (ref 12.0–46.0)
Lymphs Abs: 2.8 K/uL (ref 0.7–4.0)
MCHC: 32.5 g/dL (ref 30.0–36.0)
MCV: 82.6 fl (ref 78.0–100.0)
Monocytes Absolute: 0.5 K/uL (ref 0.1–1.0)
Monocytes Relative: 6.4 % (ref 3.0–12.0)
Neutro Abs: 3.7 K/uL (ref 1.4–7.7)
Neutrophils Relative %: 50.8 % (ref 43.0–77.0)
Platelets: 393 K/uL (ref 150.0–400.0)
RBC: 4.86 Mil/uL (ref 3.87–5.11)
RDW: 14.8 % (ref 11.5–15.5)
WBC: 7.3 K/uL (ref 4.0–10.5)

## 2023-12-03 LAB — COMPREHENSIVE METABOLIC PANEL WITH GFR
ALT: 19 U/L (ref 0–35)
AST: 16 U/L (ref 0–37)
Albumin: 4.1 g/dL (ref 3.5–5.2)
Alkaline Phosphatase: 68 U/L (ref 39–117)
BUN: 15 mg/dL (ref 6–23)
CO2: 27 meq/L (ref 19–32)
Calcium: 8.8 mg/dL (ref 8.4–10.5)
Chloride: 106 meq/L (ref 96–112)
Creatinine, Ser: 0.81 mg/dL (ref 0.40–1.20)
GFR: 76.79 mL/min (ref >60.00)
Glucose, Bld: 89 mg/dL (ref 70–99)
Potassium: 4 meq/L (ref 3.5–5.1)
Sodium: 139 meq/L (ref 135–145)
Total Bilirubin: 0.5 mg/dL (ref 0.2–1.2)
Total Protein: 7 g/dL (ref 6.0–8.3)

## 2023-12-03 LAB — HEMOGLOBIN A1C: Hgb A1c MFr Bld: 5.3 % (ref 4.6–6.5)

## 2023-12-03 LAB — LIPID PANEL
Cholesterol: 187 mg/dL (ref 0–200)
HDL: 62.5 mg/dL (ref >39.00)
LDL Cholesterol: 107 mg/dL — ABNORMAL HIGH (ref 0–99)
NonHDL: 124.67
Total CHOL/HDL Ratio: 3
Triglycerides: 87 mg/dL (ref 0.0–149.0)
VLDL: 17.4 mg/dL (ref 0.0–40.0)

## 2023-12-03 MED ORDER — ESTRADIOL 0.025 MG/24HR TD PTWK: 0.0250 mg | MEDICATED_PATCH | TRANSDERMAL | 0 refills | Status: DC

## 2023-12-03 MED ORDER — PROGESTERONE MICRONIZED 100 MG PO CAPS: 100.0000 mg | ORAL_CAPSULE | Freq: Every day | ORAL | 0 refills | Status: DC

## 2023-12-03 NOTE — Progress Notes (Signed)
 Assessment & Plan:   Assessment & Plan Obesity, class 1 Obesity, class 1, with weight gain potentially mitigated by Zepbound . Exercise is crucial for weight management and overall health improvement. - Continue Zepbound  for weight management and inflammation control. - Encouraged regular exercise, including swimming, to aid weight loss and improve overall health.  Hypothyroidism Managed with Armour thyroid . - Continue current thyroid  management with Armour thyroid .  Chronic multifocal musculoskeletal and joint pain with abnormal immunological findings Chronic multifocal musculoskeletal and joint pain with high ANA titer. Previous rheumatology consultation was unsatisfactory. Awaiting further evaluation by Dr. Bernadine. Current management includes meloxicam  and monthly prednisone, which provides significant relief. Hormone replacement therapy discussed as a potential option to improve energy and pain management. - Continue meloxicam  twice a week as needed for pain management. - Continue monthly prednisone for energy and pain relief. - Await further evaluation by Dr. Bernadine on February 26th. - Initiated hormone replacement therapy with estrogen patch and progesterone  pill to improve energy and pain management.  Unilateral primary osteoarthritis, left hip Unilateral primary osteoarthritis in the left hip. Current management includes meloxicam  and physical therapy with piezo therapy, which provides relief. - Continue meloxicam  as needed for pain management. - Continue physical therapy with piezo therapy for pain relief.  Prediabetes Managed with Zepbound , which has shown improvement in A1c levels. No recent A1c test within the last six months. - Continue Zepbound  for glycemic control. - Ordered A1c test to monitor glycemic control.  Geni Shutter, DO  [facility] [phone] (phone) [phone] (fax) Subjective:   Discussed the use of  AI scribe software for clinical note transcription with the patient, who gave verbal consent to proceed. History of Present Illness Janice Brown is a 63 year old female with chronic pain and elevated ANA titer who presents with joint and muscle pain.  Chronic musculoskeletal pain - Chronic pain present for several years, initially managed with swimming - Cessation of exercise routine due to increased work commitments has exacerbated symptoms - Deep muscle pain, especially when sitting for extended periods, such as during commutes to [place] - Commute frequency increased from two to four days per week, worsening symptoms - Significant pain in hands, with temporary relief from piezo therapy at [facility]  Arthralgia and myalgia - Joint and muscle pain persistent over several years - Pain in hands and deep muscle pain are prominent features - Meloxicam  taken twice weekly due to concern for side effects - Prednisone taken once monthly, providing significant relief but used cautiously due to awareness of side effects - Supplements include turmeric, tart cherry extract, and vitamin D   Autoimmune serology and evaluation - Elevated ANA titer - Awaiting rheumatology consultation with Dr. Bernadine in February - Considering antimalarial medication based on anecdotal benefit in a friend with similar symptoms  Sleep disturbance - Recently started using a weighted blanket and magnesium supplements to improve sleep - Reports improvement in sleep quality with these interventions  Thyroid  dysfunction - Hypothyroidism managed with Armour thyroid  medication  Inflammation management - Takes Zepbound , perceived to help with inflammation  Functional status and lifestyle impact - Works for a [employer] and commutes to [place] four days per week - Plans to reduce office days to better manage health - Considering retirement in the next few years - Recognizes need to prioritize  health and exercise regimen to manage chronic pain and improve quality of life  All past medical history, surgical history, allergies, family history, immunizations andmedications  were updated in the EMR today and reviewed under the history and medication portions of their EMR. Review of Systems: Negative, with the exception of above mentioned in HPI.   Current Outpatient Medications:    meloxicam  (MOBIC ) 15 MG tablet, Take 1 tablet (15 mg total) by mouth daily as needed., Disp: 30 tablet, Rfl: 0   thyroid  (ARMOUR THYROID ) 90 MG tablet, Take 1 tablet (90 mg total) by mouth daily., Disp: 90 tablet, Rfl: 3   tirzepatide  (ZEPBOUND ) 15 MG/0.5ML Pen, Inject 15 mg into the skin every 7 (seven) days., Disp: 2 mL, Rfl: 2   Turmeric 500 MG CAPS, Take by mouth., Disp: , Rfl:    Vitamin D -Vitamin K (VITAMIN D2 + K1 PO), Take by mouth., Disp: , Rfl:      Objective:     VITALS: BP 110/62 (BP Location: Right Arm, Cuff Size: Normal)   Pulse (!) 52   Ht 5' 5 (1.651 m)   Wt 205 lb 9.6 oz (93.3 kg)   SpO2 98%   BMI 34.21 kg/m   Wt Readings from Last 3 Encounters:  12/03/23 205 lb 9.6 oz (93.3 kg)  10/27/23 201 lb (91.2 kg)  08/18/23 201 lb (91.2 kg)   PHYSICAL EXAM: General: The patient is well-nourished, in no acute distress. The vital signs are documented above. Cardiac: There is a regular rate and rhythm.  Pulmonary: There is good air exchange bilaterally without wheezing or rales. Abdomen: Soft and non-tender with normal pitched bowel sounds.  Musculoskeletal: There are no major deformities or cyanosis. Neurologi: No focal weakness or paresthesias are detected. Skin: There are no ulcers or rashes noted. Psychiatric: The patient has a normal affect.  LABS: Lab Results  Component Value Date   CREATININE 0.76 08/18/2023   BUN 17 08/18/2023   NA 138 08/18/2023   K 4.4 08/18/2023   CL 105 08/18/2023   CO2 26 08/18/2023   Lab Results  Component Value Date   ALT 16 08/18/2023   AST 13  08/18/2023   ALKPHOS 76 08/18/2023   BILITOT 0.4 08/18/2023   Lab Results  Component Value Date   HGBA1C 5.8 (H) 05/25/2019   Lab Results  Component Value Date   INSULIN  17.6 05/25/2019   Lab Results  Component Value Date   TSH 1.77 08/18/2023   Lab Results  Component Value Date   CHOL 193 05/25/2019   HDL 53 05/25/2019   LDLCALC 112 (H) 05/25/2019   TRIG 163 (H) 05/25/2019   CHOLHDL 3.6 05/25/2019   Lab Results  Component Value Date   VD25OH 27.74 (L) 08/18/2023   VD25OH 36.7 05/25/2019   Lab Results  Component Value Date   WBC 7.7 08/18/2023   HGB 13.4 08/18/2023   HCT 41.9 08/18/2023   MCV 81.5 08/18/2023   PLT 423.0 (H) 08/18/2023   Lab Results  Component Value Date   IRON 109 05/25/2019   TIBC 385 05/25/2019   FERRITIN 73.5 08/18/2023

## 2023-12-04 ENCOUNTER — Other Ambulatory Visit (HOSPITAL_BASED_OUTPATIENT_CLINIC_OR_DEPARTMENT_OTHER): Payer: Self-pay

## 2023-12-05 ENCOUNTER — Encounter: Payer: Self-pay | Admitting: Family Medicine

## 2023-12-05 DIAGNOSIS — Z780 Asymptomatic menopausal state: Secondary | ICD-10-CM | POA: Insufficient documentation

## 2023-12-05 DIAGNOSIS — Z6834 Body mass index (BMI) 34.0-34.9, adult: Secondary | ICD-10-CM | POA: Insufficient documentation

## 2023-12-27 ENCOUNTER — Other Ambulatory Visit: Payer: Self-pay

## 2023-12-27 ENCOUNTER — Other Ambulatory Visit (HOSPITAL_BASED_OUTPATIENT_CLINIC_OR_DEPARTMENT_OTHER): Payer: Self-pay

## 2023-12-30 ENCOUNTER — Encounter (HOSPITAL_BASED_OUTPATIENT_CLINIC_OR_DEPARTMENT_OTHER): Payer: Self-pay

## 2023-12-30 ENCOUNTER — Other Ambulatory Visit: Payer: Self-pay | Admitting: Sports Medicine

## 2023-12-30 ENCOUNTER — Encounter: Payer: Self-pay | Admitting: Sports Medicine

## 2023-12-30 ENCOUNTER — Other Ambulatory Visit (HOSPITAL_BASED_OUTPATIENT_CLINIC_OR_DEPARTMENT_OTHER): Payer: Self-pay

## 2023-12-30 DIAGNOSIS — Z6834 Body mass index (BMI) 34.0-34.9, adult: Secondary | ICD-10-CM

## 2023-12-30 MED ORDER — METHYLPREDNISOLONE 4 MG PO TBPK
ORAL_TABLET | ORAL | 0 refills | Status: DC
  Filled 2023-12-30: qty 21, 6d supply, fill #0

## 2023-12-30 MED ORDER — ZEPBOUND 15 MG/0.5ML ~~LOC~~ SOAJ
15.0000 mg | SUBCUTANEOUS | 2 refills | Status: AC
  Filled 2023-12-30: qty 2, 28d supply, fill #0
  Filled 2024-01-24: qty 2, 28d supply, fill #1
  Filled 2024-02-13: qty 2, 28d supply, fill #2

## 2023-12-30 NOTE — Telephone Encounter (Signed)
 Refill request received for Zepbound  15mg  FOV:02/02/2024 LOV:12/03/2023 Last refill: 06/2023 Medication has been sent.

## 2023-12-30 NOTE — Telephone Encounter (Signed)
 Prednisone dos pak sent in

## 2024-01-11 NOTE — Progress Notes (Signed)
 "  Office Visit Note  Patient: Janice Brown             Date of Birth: August 19, 1960           MRN: [id number]             PCP: Prentiss Frieze, DO Referring: Leonce Katz, DO Visit Date: 01/17/2024 Occupation: Data Unavailable  Subjective:  Generalized pain  History of Present Illness: Janice Brown is a 63 y.o. female seen for the evaluation of positive ANA and arthralgias.  According the patient her symptoms started about 8 years ago when she started experiencing cramps in her shins.  She initially felt they were related to exercise.  She states she was very active and used to lift weights and do Peloton bike and hiking.  She states she started swimming right before pandemic and through the pandemic.  She states it helped relieve her pain.  She states in January 2024 she lost her job and also quit swimming.  She states the pain increased when she stopped swimming.  At the end of 2025 she found a job in [place] and had been commuting to [employer] [place].  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  She also was finishing furniture as a hobby for which she was sitting for prolonged time and it caused increased pain.  She states that pain improved after stretching and exercising.  She was experiencing right sided hip pain for which she was evaluated by an orthopedic surgeon and was told that she had mild osteoarthritis.  She still has intermittent pain in her right hip.  She also has osteoarthritis in her hands and has been experiencing pain in her hands which she is different than her generalized pain.  She was having increased leg cramps which was causing insomnia.  She states physical therapy helped.  She started taking estradiol  which helped her with the insomnia.  Patient states she was evaluated by Dr. Leonce for ongoing pain and discomfort.  She states she has been having hyperalgesia and even sensitivity to touch.  He did labs  and her ANA was positive.  Patient states she could not get an appointment here and went to see Dr. Ishmael who reviewed her labs and advised her to take meloxicam  on a regular basis.  No further workup was done.  She states Dr. Leonce advised her to take meloxicam  only on as needed basis.  She has been taking it every 4 days.  She states for the last 3 months Dr. Leonce has given her prednisone taper for each flare.  She has taken 3 prednisone tapers and she is currently on prednisone taper.  She states she has good days and the bad days.  Exercise does help.  She reports discomfort in her entire spine and her right hip and her hands.  She has not noticed any joint swelling.  She also has generalized muscle pain and hyperalgesia.  There is no history of oral ulcers, nasal ulcers, sicca symptoms, malar rash, Raynaud's phenomenon or inflammatory arthritis.  Patient recalls taking Cymbalta  for 2 months in 2024 and felt better but then Cymbalta  was discontinued as her physician left the practice. There is no family history of autoimmune disease.  She is right-handed, being her.  She enjoys teaching laboratory technician and also likes paper crafting which she is not able to do much due to her busy schedule.  She  is to do water walking, lifting weights Peloton and hiking on a regular basis which she is unable to do due to her schedule and discomfort.  She is married, gravida 1, para 2.  There is no history of preeclampsia or DVTs.  She drinks wine on weekends.  She smoked 1 pack/day for 15 years and quit smoking at age 72.    Activities of Daily Living:  Patient reports morning stiffness for 0 minutes.   Patient Reports nocturnal pain.  Difficulty dressing/grooming: Denies Difficulty climbing stairs: Reports Difficulty getting out of chair: Reports Difficulty using hands for taps, buttons, cutlery, and/or writing: Reports  Review of Systems  Constitutional:  Positive for fatigue.  HENT:   Negative for mouth sores and mouth dryness.   Eyes:  Negative for dryness.  Respiratory:  Negative for shortness of breath.   Cardiovascular:  Negative for chest pain and palpitations.  Gastrointestinal:  Negative for blood in stool, constipation and diarrhea.  Endocrine: Negative for increased urination.  Genitourinary:  Positive for involuntary urination.  Musculoskeletal:  Positive for gait problem, myalgias, muscle weakness, muscle tenderness and myalgias. Negative for joint pain, joint pain, joint swelling and morning stiffness.  Skin:  Positive for sensitivity to sunlight. Negative for color change, rash and hair loss.  Allergic/Immunologic: Negative for susceptible to infections.  Neurological:  Positive for dizziness. Negative for headaches.  Hematological:  Negative for swollen glands.  Psychiatric/Behavioral:  Negative for depressed mood and sleep disturbance. The patient is not nervous/anxious.     PMFS History:  Patient Active Problem List   Diagnosis Date Noted   Postmenopausal 12/05/2023   Class 1 obesity without serious comorbidity with body mass index (BMI) of 34.0 to 34.9 in adult 12/05/2023   Polyarthralgia 12/03/2023   Positive ANA (antinuclear antibody) 12/03/2023   Arthritis of left hip 12/03/2023   Prediabetes with polyphagia 12/09/2020   Asthma 04/22/2012   GERD (gastroesophageal reflux disease) 10/16/2011   Allergic rhinitis 02/13/2010   Hypothyroidism 02/13/2010    Past Medical History:  Diagnosis Date   Anemia    Asthma    Class 2 severe obesity with serious comorbidity and body mass index (BMI) of 38.0 to 38.9 in adult 12/09/2020   Hypothyroidism    Infertility, female    Obesity    Shortness of breath    Vitamin D  deficiency     Family History  Problem Relation Age of Onset   Hypertension Mother    Obesity Mother    Other Mother        progressive supranuclear palsy   Hypertension Father    Other Sister        synovial sarcoma    Hypertension Sister    Other Sister        hysterectomy   Autoimmune disease Sister    Other Daughter        Pellucid Marginal Degeneration   Healthy Daughter    Past Surgical History:  Procedure Laterality Date   CESAREAN SECTION  2000   fallopian tube removed  1998   TONSILLECTOMY  1969   Social History[1] Social History   Social History Narrative   Not on file     Immunization History  Administered Date(s) Administered   Influenza, Seasonal, Injecte, Preservative Fre 12/03/2023   Influenza,inj,Quad PF,6+ Mos 10/10/2015   Influenza-Unspecified 10/07/2011   PFIZER(Purple Top)SARS-COV-2 Vaccination 03/30/2019, 04/24/2019   Pfizer(Comirnaty )Fall Seasonal Vaccine 12 years and older 01/21/2022, 10/30/2023   Pneumococcal Polysaccharide-23 04/22/2012     Objective: Vital  Signs: BP 113/75 (BP Location: Right Arm, Patient Position: Sitting, Cuff Size: Large)   Pulse (!) 48   Temp (!) 97.3 F (36.3 C)   Resp 17   Ht 5' 4.5 (1.638 m)   Wt 209 lb (94.8 kg)   BMI 35.32 kg/m    Physical Exam Vitals and nursing note reviewed.  Constitutional:      Appearance: She is well-developed.  HENT:     Head: Normocephalic and atraumatic.  Eyes:     Conjunctiva/sclera: Conjunctivae normal.  Cardiovascular:     Rate and Rhythm: Normal rate and regular rhythm.     Heart sounds: Normal heart sounds.  Pulmonary:     Effort: Pulmonary effort is normal.     Breath sounds: Normal breath sounds.  Abdominal:     General: Bowel sounds are normal.     Palpations: Abdomen is soft.  Musculoskeletal:     Cervical back: Normal range of motion.  Lymphadenopathy:     Cervical: No cervical adenopathy.  Skin:    General: Skin is warm and dry.     Capillary Refill: Capillary refill takes less than 2 seconds.  Neurological:     Mental Status: She is alert and oriented to person, place, and time.  Psychiatric:        Behavior: Behavior normal.      Musculoskeletal Exam: Cervical,  thoracic and lumbar spine good range of motion.  Shoulders, elbows, wrist joints, MCPs PIPs and DIPs with good range of motion with no synovitis.  Bilateral PIP and DIP thickening was noted.  Hip joints and knee joints were in good range of motion without any warmth swelling or effusion.  There was no tenderness over ankles or MTPs.  She generalized hyperalgesia and positive tender points.  CDAI Exam: CDAI Score: -- Patient Global: --; Provider Global: -- Swollen: --; Tender: -- Joint Exam 01/17/2024   No joint exam has been documented for this visit   There is currently no information documented on the homunculus. Go to the Rheumatology activity and complete the homunculus joint exam.  Investigation: No additional findings.  Imaging: No results found.  Recent Labs: Lab Results  Component Value Date   WBC 7.3 12/03/2023   HGB 13.0 12/03/2023   PLT 393.0 12/03/2023   NA 139 12/03/2023   K 4.0 12/03/2023   CL 106 12/03/2023   CO2 27 12/03/2023   GLUCOSE 89 12/03/2023   BUN 15 12/03/2023   CREATININE 0.81 12/03/2023   BILITOT 0.5 12/03/2023   ALKPHOS 68 12/03/2023   AST 16 12/03/2023   ALT 19 12/03/2023   PROT 7.0 12/03/2023   ALBUMIN 4.1 12/03/2023   CALCIUM 8.8 12/03/2023   GFRAA 95 05/25/2019   August 18, 2023 ANA 1: 1280 speckled, ENA (dsDNA, RNP, Smith, SCL 70, SSA, SSB) negative, RF negative, anti-CCP negative, CRP normal, sed rate 44, TSH normal, uric acid 5.4 December 03, 2023 lipid panel LDL 107  IMPRESSION: Degenerative change of the right hip without acute abnormality.  Electronically Signed   By: Oneil Devonshire M.D.   On: 04/09/2022 21:59  Speciality Comments: No specialty comments available.  Procedures:  No procedures performed Allergies: Patient has no known allergies.   Assessment / Plan:     Visit Diagnoses: Positive ANA (antinuclear antibody) -detail counts regarding positive ANA was provided.  Presence of positive ANA and normal population was  discussed.  ENA panel was negative.  She has no clinical features of lupus.  I will obtain additional  labs today.  Plan: C3 and C4, Beta-2  glycoprotein antibodies, Cardiolipin antibodies, IgG, IgM, IgA, ANA.  Will contact her once the lab results are available.  Advised to contact me if she develops any new symptoms.  Polyarthralgia-she complains of episodic pain in her joints and muscles.  She states the muscular pain is more prominent than the joint pain.  The joint pain is mostly limited to her hips and her hands.  No synovitis was noted on the examination.  Primary osteoarthritis of both hands-she had bilateral PIP and DIP thickening.  Clinical finding suggestive of osteoarthritis.  Patient declined x-rays.  She states she has been going to physical therapy which has been helpful with the pain and discomfort in her hands.  A handout on hand exercises was given.  Arthritis of right hip-she had x-rays of the right hip in the past which showed some degenerative changes.  She has intermittent pain.  A handout on hip exercises was given.  Myalgia -she gives history of muscle cramps and episodic increased muscle pain.  Plan: CK  Other fatigue -she gives history of chronic fatigue.  Plan: Serum protein electrophoresis with reflex  Fibromyalgia-clinical findings with history of fibromyalgia.  She gives history of chronic insomnia which improved on estrogen.  She also gives history of fatigue, generalized hyperalgesia, burning sensation of her skin and episodic increased pain.  She had positive tender points and hyperalgesia on the examination.  Detailed counseling on fibromyalgia was provided.  A handout on fibromyalgia was given.  Benefits of Cymbalta  were discussed.  Patient had taken Cymbalta  in the past for couple of months which helped her symptoms.  I discouraged the use of long-term NSAIDs and prednisone.  And benefits of water aerobics, swimming, and stretching were discussed.  Other medical  problems are listed as follows:  Gastroesophageal reflux disease without esophagitis  Seasonal allergic rhinitis due to other allergic trigger  Acquired hypothyroidism  Postmenopausal  Orders: Orders Placed This Encounter  Procedures   CK   Serum protein electrophoresis with reflex   C3 and C4   Beta-2  glycoprotein antibodies   Cardiolipin antibodies, IgG, IgM, IgA   ANA   No orders of the defined types were placed in this encounter.  .  Follow-Up Instructions: Return for Positive ANA, Osteoarthritis.   Maya Nash, MD  Note - This record has been created using Animal nutritionist.  Chart creation errors have been sought, but may not always  have been located. Such creation errors do not reflect on  the standard of medical care.     [1]  Social History Tobacco Use   Smoking status: Former    Types: Cigarettes    Passive exposure: Never   Smokeless tobacco: Never   Tobacco comments:    Quit smoking 33 years ago.   Vaping Use   Vaping status: Never Used  Substance Use Topics   Alcohol use: Yes    Comment: occ   Drug use: Never   "

## 2024-01-17 ENCOUNTER — Ambulatory Visit: Attending: Rheumatology | Admitting: Rheumatology

## 2024-01-17 ENCOUNTER — Encounter: Payer: Self-pay | Admitting: Rheumatology

## 2024-01-17 VITALS — BP 113/75 | HR 48 | Temp 97.3°F | Resp 17 | Ht 64.5 in | Wt 209.0 lb

## 2024-01-17 DIAGNOSIS — Z780 Asymptomatic menopausal state: Secondary | ICD-10-CM

## 2024-01-17 DIAGNOSIS — R7689 Other specified abnormal immunological findings in serum: Secondary | ICD-10-CM | POA: Diagnosis not present

## 2024-01-17 DIAGNOSIS — M19042 Primary osteoarthritis, left hand: Secondary | ICD-10-CM | POA: Diagnosis not present

## 2024-01-17 DIAGNOSIS — M797 Fibromyalgia: Secondary | ICD-10-CM

## 2024-01-17 DIAGNOSIS — M791 Myalgia, unspecified site: Secondary | ICD-10-CM

## 2024-01-17 DIAGNOSIS — M2550 Pain in unspecified joint: Secondary | ICD-10-CM

## 2024-01-17 DIAGNOSIS — M1611 Unilateral primary osteoarthritis, right hip: Secondary | ICD-10-CM

## 2024-01-17 DIAGNOSIS — J3089 Other allergic rhinitis: Secondary | ICD-10-CM

## 2024-01-17 DIAGNOSIS — E6609 Other obesity due to excess calories: Secondary | ICD-10-CM

## 2024-01-17 DIAGNOSIS — J4520 Mild intermittent asthma, uncomplicated: Secondary | ICD-10-CM

## 2024-01-17 DIAGNOSIS — M19041 Primary osteoarthritis, right hand: Secondary | ICD-10-CM

## 2024-01-17 DIAGNOSIS — K219 Gastro-esophageal reflux disease without esophagitis: Secondary | ICD-10-CM | POA: Diagnosis not present

## 2024-01-17 DIAGNOSIS — E039 Hypothyroidism, unspecified: Secondary | ICD-10-CM | POA: Diagnosis not present

## 2024-01-17 DIAGNOSIS — R5383 Other fatigue: Secondary | ICD-10-CM

## 2024-01-17 DIAGNOSIS — M1612 Unilateral primary osteoarthritis, left hip: Secondary | ICD-10-CM

## 2024-01-17 DIAGNOSIS — R7303 Prediabetes: Secondary | ICD-10-CM

## 2024-01-17 NOTE — Patient Instructions (Addendum)
 Hip Exercises Ask your health care provider which exercises are safe for you. Do exercises exactly as told by your provider and adjust them as told. It is normal to feel mild stretching, pulling, tightness, or discomfort as you do these exercises. Stop right away if you feel sudden pain or your pain gets worse. Do not begin these exercises until told by your provider. Stretching and range-of-motion exercises These exercises warm up your muscles and joints and improve the movement and flexibility of your hip. They also help to relieve pain, numbness, and tingling. You may be asked to limit your range of motion if you had a hip replacement. Talk to your provider about these limits. Hamstrings, supine  Lie on your back (supine position). Loop a belt, towel, or exercise band over the ball of your left / right foot. The ball of your foot is on the walking surface, right under your toes. Straighten your left / right knee and slowly pull on the belt, towel, or band to raise your leg until you feel a gentle stretch behind your knee (hamstring). Do not let your knee bend while you do this. Keep your other leg flat on the floor. Hold this position for __________ seconds. Slowly return your leg to the starting position. Repeat __________ times. Complete this exercise __________ times a day. Hip rotation  Lie on your back on a firm surface. With your left / right hand, gently pull your left / right knee toward the shoulder that is on the same side of the body. Stop when your knee is pointing toward the ceiling. Hold your left / right ankle with your other hand. Keeping your knee steady, gently pull your left / right ankle toward your other shoulder until you feel a stretch in your butt. Keep your hips and shoulders firmly planted while you do this stretch. Hold this position for __________ seconds. Repeat __________ times. Complete this exercise __________ times a day. Seated stretch This exercise is  sometimes called hamstrings and adductors stretch. Sit on the floor with your legs stretched wide. Keep your knees straight during this exercise. Keeping your head and back in a straight line, bend at your waist to reach for your left foot (position A). You should feel a stretch in your right inner thigh (adductors). Hold this position for __________ seconds. Then slowly return to the upright position. Keeping your head and back in a straight line, bend at your waist to reach forward (position B). You should feel a stretch behind both of your thighs and knees (hamstrings). Hold this position for __________ seconds. Then slowly return to the upright position. Keeping your head and back in a straight line, bend at your waist to reach for your right foot (position C). You should feel a stretch in your left inner thigh (adductors). Hold this position for __________ seconds. Then slowly return to the upright position. Repeat __________ times. Complete this exercise __________ times a day. Lunge This exercise stretches the muscles of the hip (hip flexors). Place your left / right knee on the floor and bend your other knee so that is directly over your ankle. You should be half-kneeling. Keep good posture with your head over your shoulders. Tighten your butt muscles to point your tailbone downward. This will prevent your back from arching too much. You should feel a gentle stretch in the front of your left / right thigh and hip. If you do not feel a stretch, slide your other foot forward slightly and  then slowly lunge forward with your chest up until your knee once again lines up over your ankle. Make sure your tailbone continues to point downward. Hold this position for __________ seconds. Slowly return to the starting position. Repeat __________ times. Complete this exercise __________ times a day. Strengthening exercises These exercises build strength and endurance in your hip. Endurance is the  ability to use your muscles for a long time, even after they get tired. Bridge This exercise strengthens the muscles of your hip (hip extensors). Lie on your back on a firm surface with your knees bent and your feet flat on the floor. Tighten your butt muscles and lift your bottom off the floor until the trunk of your body and your hips are level with your thighs. Do not arch your back. You should feel the muscles working in your butt and the back of your thighs. If you do not feel these muscles, slide your feet 1-2 inches (2.5-5 cm) farther away from your butt. Hold this position for __________ seconds. Slowly lower your hips to the starting position. Let your muscles relax completely between repetitions. Repeat __________ times. Complete this exercise __________ times a day. Straight leg raises, side-lying This exercise strengthens the muscles that move the hip joint away from the center of the body (hip abductors). Lie on your side with your left / right leg in the top position. Lie so your head, shoulder, hip, and knee line up. You may bend your bottom knee slightly to help you balance. Roll your hips slightly forward, so your hips are stacked directly over each other and your left / right knee is facing forward. Leading with your heel, lift your top leg 4-6 inches (10-15 cm). You should feel the muscles in your top hip lifting. Do not let your foot drift forward. Do not let your knee roll toward the ceiling. Hold this position for __________ seconds. Slowly return to the starting position. Let your muscles relax completely between repetitions. Repeat __________ times. Complete this exercise __________ times a day. Straight leg raises, side-lying This exercise strengthens the muscles that move the hip joint toward the center of the body (hip adductors). Lie on your side with your left / right leg in the bottom position. Lie so your head, shoulder, hip, and knee line up. You may place your  upper foot in front to help you balance. Roll your hips slightly forward, so your hips are stacked directly over each other and your left / right knee is facing forward. Tense the muscles in your inner thigh and lift your bottom leg 4-6 inches (10-15 cm). Hold this position for __________ seconds. Slowly return to the starting position. Let your muscles relax completely between repetitions. Repeat __________ times. Complete this exercise __________ times a day. Straight leg raises, supine This exercise strengthens the muscles in the front of your thigh (quadriceps and hip flexors). Lie on your back (supine position) with your left / right leg extended and your other knee bent. Tense the muscles in the front of your left / right thigh. You should see your kneecap slide up or see increased dimpling just above your knee. Keep these muscles tight as you raise your leg 4-6 inches (10-15 cm) off the floor. Do not let your knee bend. Hold this position for __________ seconds. Keep these muscles tense as you lower your leg. Relax the muscles slowly and completely between repetitions. Repeat __________ times. Complete this exercise __________ times a day. Hip abductors, standing This  exercise strengthens the muscles that move the leg and hip joint away from the center of the body (hip abductors). Tie one end of a rubber exercise band or tubing to a secure surface, such as a chair, table, or pole. Loop the other end of the band or tubing around your left / right ankle. Keeping your ankle with the band or tubing directly opposite the secured end, step away until there is tension in the tubing or band. Hold on to a chair, table, or pole as needed for balance. Lift your left / right leg out to your side. While you do this: Keep your back upright. Keep your shoulders over your hips. Keep your toes pointing forward. Make sure to use your hip muscles to slowly lift your leg. Do not tip your body or  forcefully lift your leg. Hold this position for __________ seconds. Slowly return to the starting position. Repeat __________ times. Complete this exercise __________ times a day. Squats This exercise strengthens the muscles in the front of your thigh (quadriceps). Stand in front of a table, or stand in a doorframe so your feet and knees are in line with the frame. You may place your hands on the table or frame for balance. Slowly bend your knees and lower your hips like you are going to sit in a chair. Keep your lower legs in a straight up-and-down position. Do not let your hips go lower than your knees. Do not bend your knees lower than told by your provider. If your hip pain increases, do not bend as low. Hold this position for ___________ seconds. Slowly push with your legs to return to standing. Do not use your hands to pull yourself to standing. Repeat __________ times. Complete this exercise __________ times a day. This information is not intended to replace advice given to you by your health care provider. Make sure you discuss any questions you have with your health care provider. Document Revised: 09/02/2021 Document Reviewed: 09/02/2021 Else[employer] Patient Education  2024 [employer]Hand Exercises Hand exercises can be helpful for almost anyone. They can strengthen your hands and improve flexibility and movement. The exercises can also increase blood flow to the hands. These results can make your work and daily tasks easier for you. Hand exercises can be especially helpful for people who have joint pain from arthritis or nerve damage from using their hands over and over. These exercises can also help people who injure a hand. Exercises Most of these hand exercises are gentle stretching and motion exercises. It is usually safe to do them often throughout the day. Warming up your hands before exercise may help reduce stiffness. You can do this with gentle massage or by placing your  hands in warm water for 10-15 minutes. It is normal to feel some stretching, pulling, tightness, or mild discomfort when you begin new exercises. In time, this will improve. Remember to always be careful and stop right away if you feel sudden, very bad pain or your pain gets worse. You want to get better and be safe. Ask your health care provider which exercises are safe for you. Do exercises exactly as told by your provider and adjust them as told. Do not begin these exercises until told by your provider. Knuckle bend or claw fist  Stand or sit with your arm, hand, and all five fingers pointed straight up. Make sure to keep your wrist straight. Gently bend your fingers down toward your palm until the tips of your fingers  are touching your palm. Keep your big knuckle straight and only bend the small knuckles in your fingers. Hold this position for 10 seconds. Straighten your fingers back to your starting position. Repeat this exercise 5-10 times with each hand. Full finger fist  Stand or sit with your arm, hand, and all five fingers pointed straight up. Make sure to keep your wrist straight. Gently bend your fingers into your palm until the tips of your fingers are touching the middle of your palm. Hold this position for 10 seconds. Extend your fingers back to your starting position, stretching every joint fully. Repeat this exercise 5-10 times with each hand. Straight fist  Stand or sit with your arm, hand, and all five fingers pointed straight up. Make sure to keep your wrist straight. Gently bend your fingers at the big knuckle, where your fingers meet your hand, and at the middle knuckle. Keep the knuckle at the tips of your fingers straight and try to touch the bottom of your palm. Hold this position for 10 seconds. Extend your fingers back to your starting position, stretching every joint fully. Repeat this exercise 5-10 times with each hand. Tabletop  Stand or sit with your arm,  hand, and all five fingers pointed straight up. Make sure to keep your wrist straight. Gently bend your fingers at the big knuckle, where your fingers meet your hand, as far down as you can. Keep the small knuckles in your fingers straight. Think of forming a tabletop with your fingers. Hold this position for 10 seconds. Extend your fingers back to your starting position, stretching every joint fully. Repeat this exercise 5-10 times with each hand. Finger spread  Place your hand flat on a table with your palm facing down. Make sure your wrist stays straight. Spread your fingers and thumb apart from each other as far as you can until you feel a gentle stretch. Hold this position for 10 seconds. Bring your fingers and thumb tight together again. Hold this position for 10 seconds. Repeat this exercise 5-10 times with each hand. Making circles  Stand or sit with your arm, hand, and all five fingers pointed straight up. Make sure to keep your wrist straight. Make a circle by touching the tip of your thumb to the tip of your index finger. Hold for 10 seconds. Then open your hand wide. Repeat this motion with your thumb and each of your fingers. Repeat this exercise 5-10 times with each hand. Thumb motion  Sit with your forearm resting on a table and your wrist straight. Your thumb should be facing up toward the ceiling. Keep your fingers relaxed as you move your thumb. Lift your thumb up as high as you can toward the ceiling. Hold for 10 seconds. Bend your thumb across your palm as far as you can, reaching the tip of your thumb for the small finger (pinkie) side of your palm. Hold for 10 seconds. Repeat this exercise 5-10 times with each hand. Grip strengthening  Hold a stress ball or other soft ball in the middle of your hand. Slowly increase the pressure, squeezing the ball as much as you can without causing pain. Think of bringing the tips of your fingers into the middle of your palm. All of  your finger joints should bend when doing this exercise. Hold your squeeze for 10 seconds, then relax. Repeat this exercise 5-10 times with each hand. Contact a health care provider if: Your hand pain or discomfort gets much worse when you  do an exercise. Your hand pain or discomfort does not improve within 2 hours after you exercise. If you have either of these problems, stop doing these exercises right away. Do not do them again unless your provider says that you can. Get help right away if: You develop sudden, severe hand pain or swelling. If this happens, stop doing these exercises right away. Do not do them again unless your provider says that you can. This information is not intended to replace advice given to you by your health care provider. Make sure you discuss any questions you have with your health care provider. Document Revised: 01/13/2022 Document Reviewed: 01/13/2022 Elsevier Patient Education  2024 [employer]Myofascial Pain Syndrome and Fibromyalgia Myofascial pain syndrome and fibromyalgia are both pain disorders. You may feel this pain mainly in your muscles. Myofascial pain syndrome: Always has tender points in the muscles that will cause pain when pressed (trigger points). The pain may come and go. Usually affects your neck, upper back, and shoulder areas. The pain often moves into your arms and hands. Fibromyalgia: Has muscle pains and tenderness that come and go. Is often associated with tiredness (fatigue) and sleep problems. Has trigger points. Tends to be long-lasting (chronic), but is not life-threatening. Fibromyalgia and myofascial pain syndrome are not the same. However, they often occur together. If you have both conditions, each can make the other worse. Both are common and can cause enough pain and fatigue to make day-to-day activities difficult. Both can be hard to diagnose because their symptoms are common in many other conditions. What are the  causes? The exact causes of these conditions are not known. What increases the risk? You are more likely to develop either of these conditions if: You have a family history of the condition. You are female. You have certain triggers, such as: Spine disorders. An injury (trauma) or other physical stressors. Being under a lot of stress. Medical conditions such as osteoarthritis, rheumatoid arthritis, or lupus. What are the signs or symptoms? Fibromyalgia The main symptom of fibromyalgia is widespread pain and tenderness in your muscles. Pain is sometimes described as stabbing, shooting, or burning. You may also have: Tingling or numbness. Sleep problems and fatigue. Problems with attention and concentration (fibro fog). Other symptoms may include: Bowel and bladder problems. Headaches. Vision problems. Sensitivity to odors and noises. Depression or mood changes. Painful menstrual periods (dysmenorrhea). Dry skin or eyes. These symptoms can vary over time. Myofascial pain syndrome Symptoms of myofascial pain syndrome include: Tight, ropy bands of muscle. Uncomfortable sensations in muscle areas. These may include aching, cramping, burning, numbness, tingling, and weakness. Difficulty moving certain parts of the body freely (poor range of motion). How is this diagnosed? This condition may be diagnosed by your symptoms and medical history. You will also have a physical exam. In general: Fibromyalgia is diagnosed if you have pain, fatigue, and other symptoms for more than 3 months, and symptoms cannot be explained by another condition. Myofascial pain syndrome is diagnosed if you have trigger points in your muscles, and those trigger points are tender and cause pain elsewhere in your body (referred pain). How is this treated? Treatment for these conditions depends on the type that you have. For fibromyalgia, a healthy lifestyle is the most important treatment including aerobic and  strength exercises. Different types of medicines are used to help treat pain and include: NSAIDs. Medicines for treating depression. Medicines that help control seizures. Medicines that relax the muscles. Treatment for myofascial pain syndrome includes:  Pain medicines, such as NSAIDs. Cooling and stretching of muscles. Massage therapy with myofascial release technique. Trigger point injections. Treating these conditions often requires a team of health care providers. These may include: Your primary care provider. A physical therapist. Complementary health care providers, such as massage therapists or acupuncturists. A psychiatrist for cognitive behavioral therapy. Follow these instructions at home: Medicines Take over-the-counter and prescription medicines only as told by your health care provider. Ask your health care provider if the medicine prescribed to you: Requires you to avoid driving or using machinery. Can cause constipation. You may need to take these actions to prevent or treat constipation: Drink enough fluid to keep your urine pale yellow. Take over-the-counter or prescription medicines. Eat foods that are high in fiber, such as beans, whole grains, and fresh fruits and vegetables. Limit foods that are high in fat and processed sugars, such as fried or sweet foods. Lifestyle  Do exercises as told by your health care provider or physical therapist. Practice relaxation techniques to control your stress. You may want to try: Biofeedback. Visual imagery. Hypnosis. Muscle relaxation. Yoga. Meditation. Maintain a healthy lifestyle. This includes eating a healthy diet and getting enough sleep. Do not use any products that contain nicotine or tobacco. These products include cigarettes, chewing tobacco, and vaping devices, such as e-cigarettes. If you need help quitting, ask your health care provider. General instructions Talk to your health care provider about complementary  treatments, such as acupuncture or massage. Do not do activities that stress or strain your muscles. This includes repetitive motions and heavy lifting. Keep all follow-up visits. This is important. Where to find support Consider joining a support group with others who are diagnosed with this condition. National Fibromyalgia Association: fmaware.org Where to find more information [place] Foundation: uspainfoundation.org Contact a health care provider if: You have new symptoms. Your symptoms get worse or your pain is severe. You have side effects from your medicines. You have trouble sleeping. Your condition is causing depression or anxiety. Get help right away if: You have thoughts of hurting yourself or others. Get help right away if you feel like you may hurt yourself or others, or have thoughts about taking your own life. Go to your nearest emergency room or: Call 911. Call the National Suicide Prevention Lifeline at 367-387-7539 or 988. This is open 24 hours a day. Text the Crisis Text Line at 762-276-3644. This information is not intended to replace advice given to you by your health care provider. Make sure you discuss any questions you have with your health care provider. Document Revised: 10/06/2021 Document Reviewed: 11/29/2020 Elsevier Patient Education  2024 [employer].

## 2024-01-17 NOTE — Addendum Note (Signed)
 Addended by: YVONE DAVED BROCKS on: 01/17/2024 09:42 AM   Modules accepted: Orders

## 2024-01-19 LAB — BETA-2 GLYCOPROTEIN ANTIBODIES
Beta-2 Glyco 1 IgA: 18.9 U/mL
Beta-2 Glyco 1 IgM: <2 U/mL
Beta-2 Glyco I IgG: <2 U/mL

## 2024-01-19 LAB — CARDIOLIPIN ANTIBODIES, IGG, IGM, IGA
Anticardiolipin IgA: 12.4 [APL'U]/mL
Anticardiolipin IgG: <2 [GPL'U]/mL
Anticardiolipin IgM: <2 [MPL'U]/mL

## 2024-01-19 LAB — PROTEIN ELECTROPHORESIS, SERUM, WITH REFLEX
Albumin ELP: 4.1 g/dL (ref 3.8–4.8)
Alpha 1: 0.3 g/dL (ref 0.2–0.3)
Alpha 2: 0.7 g/dL (ref 0.5–0.9)
Beta 2: 0.4 g/dL (ref 0.2–0.5)
Beta Globulin: 0.5 g/dL (ref 0.4–0.6)
Gamma Globulin: 0.8 g/dL (ref 0.8–1.7)
Total Protein: 6.8 g/dL (ref 6.1–8.1)

## 2024-01-19 LAB — ANA: Anti Nuclear Antibody (ANA): POSITIVE — AB

## 2024-01-19 LAB — ANTI-NUCLEAR AB-TITER (ANA TITER): ANA Titer 1: 1:640 {titer} — ABNORMAL HIGH

## 2024-01-19 LAB — CK: Total CK: 34 U/L (ref 20–243)

## 2024-01-19 LAB — C3 AND C4
C3 Complement: 148 mg/dL (ref 83–193)
C4 Complement: 29 mg/dL (ref 15–57)

## 2024-01-20 ENCOUNTER — Ambulatory Visit: Admitting: Family Medicine

## 2024-01-20 ENCOUNTER — Ambulatory Visit: Payer: Self-pay | Admitting: Rheumatology

## 2024-01-20 NOTE — Progress Notes (Signed)
 ANA  positive at a lower titer, complements normal, beta-2  GP 1 negative, anticardiolipin negative, SPEP normal, CK normal.  I will discuss results at the follow-up visit.

## 2024-01-24 ENCOUNTER — Other Ambulatory Visit (HOSPITAL_BASED_OUTPATIENT_CLINIC_OR_DEPARTMENT_OTHER): Payer: Self-pay

## 2024-01-27 ENCOUNTER — Encounter: Payer: Self-pay | Admitting: Family Medicine

## 2024-01-27 ENCOUNTER — Other Ambulatory Visit (HOSPITAL_BASED_OUTPATIENT_CLINIC_OR_DEPARTMENT_OTHER): Payer: Self-pay

## 2024-01-27 ENCOUNTER — Other Ambulatory Visit: Payer: Self-pay

## 2024-01-27 ENCOUNTER — Ambulatory Visit (INDEPENDENT_AMBULATORY_CARE_PROVIDER_SITE_OTHER): Admitting: Family Medicine

## 2024-01-27 VITALS — BP 104/62 | HR 57 | Ht 64.5 in | Wt 205.2 lb

## 2024-01-27 DIAGNOSIS — M797 Fibromyalgia: Secondary | ICD-10-CM | POA: Diagnosis not present

## 2024-01-27 DIAGNOSIS — R635 Abnormal weight gain: Secondary | ICD-10-CM

## 2024-01-27 DIAGNOSIS — N951 Menopausal and female climacteric states: Secondary | ICD-10-CM

## 2024-01-27 DIAGNOSIS — Z6834 Body mass index (BMI) 34.0-34.9, adult: Secondary | ICD-10-CM

## 2024-01-27 DIAGNOSIS — E66811 Obesity, class 1: Secondary | ICD-10-CM | POA: Diagnosis not present

## 2024-01-27 MED ORDER — PROGESTERONE MICRONIZED 100 MG PO CAPS
100.0000 mg | ORAL_CAPSULE | Freq: Every day | ORAL | 3 refills | Status: AC
  Filled 2024-01-27: qty 30, 30d supply, fill #0
  Filled 2024-02-13: qty 90, 90d supply, fill #0

## 2024-01-27 MED ORDER — DULOXETINE HCL 60 MG PO CPEP
60.0000 mg | ORAL_CAPSULE | Freq: Every day | ORAL | 3 refills | Status: DC
  Filled 2024-01-27: qty 30, 30d supply, fill #0

## 2024-01-27 MED ORDER — ESTRADIOL 0.025 MG/24HR TD PTWK
0.0250 mg | MEDICATED_PATCH | TRANSDERMAL | 3 refills | Status: AC
  Filled 2024-01-27: qty 12, 83d supply, fill #0
  Filled 2024-01-27 – 2024-02-02 (×2): qty 4, 28d supply, fill #0
  Filled 2024-02-13: qty 12, 84d supply, fill #0

## 2024-01-27 NOTE — Progress Notes (Signed)
 "    Patient Care Team: Prentiss Frieze, DO as PCP - General (Family Medicine)  Weight Management:   Starting weight: 222lb Starting date: 05/25/19 Today's weight: 205lb Today's date: 02/07/2024 Total lbs lost to date: 17lb Total lbs lost since last in-office visit: -4lb Total weight loss percentage to date: -7.66% Body mass index is 34.68 kg/m.   Note: Weight gain over the past year with current job. Plans to retire in the next year.   Diagnoses and Orders:   1. Fibromyalgia   2. Postmenopausal disorder   3. Weight gain   4. Class 1 obesity with serious comorbidity and body mass index (BMI) of 34.0 to 34.9 in adult, unspecified obesity type    Meds ordered this encounter  Medications   estradiol  (CLIMARA  - DOSED IN MG/24 HR) 0.025 mg/24hr patch    Sig: Place 1 patch (0.025 mg total) onto the skin once a week.    Dispense:  12 patch    Refill:  3   progesterone  (PROMETRIUM ) 100 MG capsule    Sig: Take 1 capsule (100 mg total) by mouth daily.    Dispense:  90 capsule    Refill:  3   DULoxetine  (CYMBALTA ) 60 MG capsule    Sig: Take 1 capsule (60 mg total) by mouth daily.    Dispense:  90 capsule    Refill:  3   No orders of the defined types were placed in this encounter.  Assessment & Plan:   Assessment and Plan Assessment & Plan Fibromyalgia Chronic multifocal musculoskeletal and joint pain with abnormal immunological findings. Cymbalta  previously effective for pain and mood. Physical therapy initiated. Discussed exercise and weight management. Cymbalta  expected to take eight weeks for full impact. - Prescribed Cymbalta  60 mg daily. - Continue physical therapy twice a week. - Encouraged resumption of exercise, particularly water walking. - Discussed potential use of stimulants for chronic fatigue if needed.  Postmenopausal disorder Managed with estrogen replacement therapy. Improvement in skin, nail health, and eyebrow regrowth. Estrogen may support fibromyalgia  management. - Continue current estrogen replacement therapy regimen.  Class 1 obesity Recent weight gain due to decreased exercise and increased stress. Tirzepatide  preventing further weight gain. Discussed weight management in context of fibromyalgia and overall health. - Continue Tirzepatide  15 mg/0.5 mL subcutaneous every 7 days. - Encouraged dietary modifications and increased physical activity.  Subjective:   History of Present Illness Janice Brown is a 64 year old female with fibromyalgia who presents with worsening pain and fatigue.  Diffuse musculoskeletal pain and fatigue - Worsening diffuse pain and fatigue attributed to fibromyalgia. - Symptoms have intensified with decreased exercise due to work demands. - Attends physical therapy twice weekly for gentle movement and lymphatic massage. - Significant fatigue associated with fibromyalgia. - Desires improved pain and fatigue control prior to focusing on weight loss.  Migraine headaches - Severe migraine episode last week lasting approximately twenty minutes after discontinuing migraine medication. - Identifies photosensitivity as a migraine trigger.  Medication history and response - Fibromyalgia diagnosed by Dr. Bernadine. - Cymbalta  previously improved depression and hip pain; discontinued when prescriber left. - Meloxicam  and prednisone previously used for pain, discontinued due to concern for long-term effects. - Currently takes Zepbound  once weekly, which has helped prevent weight gain. - Also takes turmeric, K2, D3, and magnesium.  Weight management - Plans to focus on weight loss after achieving better pain and fatigue control.  Cardiometabolic and autoimmune findings - Slightly elevated LDL cholesterol. - Positive ANA  titer, raising concern for possible progression to lupus.  Alcohol use - Drinks wine on weekends and occasional champagne with friends. - Has reduced alcohol intake.  Review of Systems:  Negative, with the exception of above mentioned in HPI.  History:   Reviewed by clinician on day of visit: allergies, medications, problem list, medical history, surgical history, family history, social history, and previous encounter notes.  Medications:   Show/hide medication list[1] Allergies[2]  Objective:   BP 104/62 (BP Location: Left Arm, Cuff Size: Large)   Pulse (!) 57   Ht 5' 4.5 (1.638 m)   Wt 205 lb 3.2 oz (93.1 kg)   SpO2 98%   BMI 34.68 kg/m   Physical Exam Constitutional:      General: She is not in acute distress.    Appearance: She is well-developed.  HENT:     Head: Normocephalic and atraumatic.  Eyes:     Conjunctiva/sclera: Conjunctivae normal.  Cardiovascular:     Rate and Rhythm: Normal rate and regular rhythm.     Heart sounds: Normal heart sounds.  Pulmonary:     Effort: Pulmonary effort is normal.     Breath sounds: Normal breath sounds.  Neurological:     General: No focal deficit present.     Mental Status: She is alert.  Psychiatric:        Behavior: Behavior normal.    Results for orders placed or performed in visit on 01/17/24  C3 and C4   Collection Time: 01/17/24  9:40 AM  Result Value Ref Range   C3 Complement 148 83 - 193 mg/dL   C4 Complement 29 15 - 57 mg/dL  Beta-2  glycoprotein antibodies   Collection Time: 01/17/24  9:40 AM  Result Value Ref Range   Beta-2  Glyco I IgG <2.0 <20.0 U/mL   Beta-2  Glyco 1 IgM <2.0 <20.0 U/mL   Beta-2  Glyco 1 IgA 18.9 <20.0 U/mL  Cardiolipin antibodies, IgG, IgM, IgA   Collection Time: 01/17/24  9:40 AM  Result Value Ref Range   Anticardiolipin IgA 12.4 <20.0 APL-U/mL   Anticardiolipin IgG <2.0 <20.0 GPL-U/mL   Anticardiolipin IgM <2.0 <20.0 MPL-U/mL  ANA   Collection Time: 01/17/24  9:40 AM  Result Value Ref Range   Anti Nuclear Antibody (ANA) POSITIVE (A) NEGATIVE  Serum protein electrophoresis with reflex   Collection Time: 01/17/24  9:40 AM  Result Value Ref Range   Total Protein  6.8 6.1 - 8.1 g/dL   Albumin ELP 4.1 3.8 - 4.8 g/dL   Alpha 1 0.3 0.2 - 0.3 g/dL   Alpha 2 0.7 0.5 - 0.9 g/dL   Beta Globulin 0.5 0.4 - 0.6 g/dL   Beta 2 0.4 0.2 - 0.5 g/dL   Gamma Globulin 0.8 0.8 - 1.7 g/dL   SPE Interp.    CK   Collection Time: 01/17/24  9:40 AM  Result Value Ref Range   Total CK 34 20 - 243 U/L  Anti-nuclear ab-titer (ANA titer)   Collection Time: 01/17/24  9:40 AM  Result Value Ref Range   ANA Titer 1 1:640 (H) titer   ANA Pattern 1 Nuclear, Dense Fine Speckled (A)     Attestations:   Reviewed by clinician on day of visit: allergies, medications, problem list, medical history, surgical history, family history, social history, and previous encounter notes. As the patient's primary care physician, board-certified in Family Medicine and Obesity Medicine, I am providing ongoing, comprehensive obesity care based on the pillars of obesity medicine, including nutrition  therapy, physical activity, behavioral modification, and pharmacologic treatment.  Geni Shutter, DO, MS (Nutrition), FAAFP, Dipl. ABOM Fellow, American Academy of [employer] Physicians Diplomate, American Board of Obesity Medicine [facility] [address] [place], [place] [address] Dept: [phone] Fax: [phone]     [1]  Outpatient Medications Prior to Visit  Medication Sig   Cholecalciferol (VITAMIN D -3 PO) Take by mouth daily. With K2 and magnesium   meloxicam  (MOBIC ) 15 MG tablet Take 1 tablet (15 mg total) by mouth daily as needed.   TART CHERRY PO Take by mouth daily.   thyroid  (ARMOUR THYROID ) 90 MG tablet Take 1 tablet (90 mg total) by mouth daily.   tirzepatide  (ZEPBOUND ) 15 MG/0.5ML Pen Inject 15 mg into the skin every 7 (seven) days.   Turmeric 500 MG CAPS Take by mouth.   [DISCONTINUED] estradiol  (CLIMARA  - DOSED IN MG/24 HR) 0.025 mg/24hr patch Place 1 patch (0.025 mg total) onto the skin once a week.   [DISCONTINUED]  progesterone  (PROMETRIUM ) 100 MG capsule Take 1 capsule (100 mg total) by mouth daily.   Vitamin D -Vitamin K (VITAMIN D2 + K1 PO) Take by mouth. (Patient not taking: Reported on 01/27/2024)   [DISCONTINUED] methylPREDNISolone  (MEDROL  DOSEPAK) 4 MG TBPK tablet Take 6 tablets by mouth on day 1.  Take 5 tablets on day 2.  Take 4 tablets on day 3.  Take 3 tablets on day 4.  Take 2 tablets on day 5.  Take 1 tablet on day 6.   No facility-administered medications prior to visit.  [2] No Known Allergies  "

## 2024-02-02 ENCOUNTER — Ambulatory Visit: Admitting: Family Medicine

## 2024-02-02 ENCOUNTER — Other Ambulatory Visit (HOSPITAL_BASED_OUTPATIENT_CLINIC_OR_DEPARTMENT_OTHER): Payer: Self-pay

## 2024-02-07 ENCOUNTER — Encounter: Payer: Self-pay | Admitting: Family Medicine

## 2024-02-07 DIAGNOSIS — N951 Menopausal and female climacteric states: Secondary | ICD-10-CM | POA: Insufficient documentation

## 2024-02-07 DIAGNOSIS — M797 Fibromyalgia: Secondary | ICD-10-CM | POA: Insufficient documentation

## 2024-02-14 ENCOUNTER — Other Ambulatory Visit (HOSPITAL_BASED_OUTPATIENT_CLINIC_OR_DEPARTMENT_OTHER): Payer: Self-pay

## 2024-02-14 ENCOUNTER — Other Ambulatory Visit: Payer: Self-pay

## 2024-02-14 MED ORDER — DULOXETINE HCL 30 MG PO CPEP
30.0000 mg | ORAL_CAPSULE | Freq: Every day | ORAL | 1 refills | Status: AC
  Filled 2024-02-14: qty 30, 30d supply, fill #0

## 2024-02-18 ENCOUNTER — Other Ambulatory Visit (HOSPITAL_BASED_OUTPATIENT_CLINIC_OR_DEPARTMENT_OTHER): Payer: Self-pay

## 2024-03-09 ENCOUNTER — Ambulatory Visit: Admitting: Rheumatology

## 2024-04-04 ENCOUNTER — Ambulatory Visit: Admitting: Family Medicine

## 2024-04-06 ENCOUNTER — Ambulatory Visit: Admitting: Rheumatology
